# Patient Record
Sex: Male | Born: 1985
Health system: Southern US, Community
[De-identification: ages and names within clinical notes are randomized; demographics above are authoritative.]

## PROBLEM LIST (undated history)

## (undated) DIAGNOSIS — M1990 Unspecified osteoarthritis, unspecified site: Secondary | ICD-10-CM

## (undated) DIAGNOSIS — M419 Scoliosis, unspecified: Secondary | ICD-10-CM

## (undated) DIAGNOSIS — L4050 Arthropathic psoriasis, unspecified: Secondary | ICD-10-CM

## (undated) HISTORY — PX: KNEE SURGERY: SHX244

## (undated) HISTORY — PX: TONSILLECTOMY: SUR1361

---
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
## 2015-05-13 DIAGNOSIS — L4050 Arthropathic psoriasis, unspecified: Secondary | ICD-10-CM | POA: Diagnosis not present

## 2015-05-13 DIAGNOSIS — M419 Scoliosis, unspecified: Secondary | ICD-10-CM | POA: Diagnosis not present

## 2015-05-13 DIAGNOSIS — M549 Dorsalgia, unspecified: Secondary | ICD-10-CM | POA: Diagnosis not present

## 2015-05-13 DIAGNOSIS — M2550 Pain in unspecified joint: Secondary | ICD-10-CM | POA: Diagnosis not present

## 2015-05-13 DIAGNOSIS — L409 Psoriasis, unspecified: Secondary | ICD-10-CM | POA: Diagnosis not present

## 2015-06-17 DIAGNOSIS — L409 Psoriasis, unspecified: Secondary | ICD-10-CM | POA: Diagnosis not present

## 2015-06-17 DIAGNOSIS — M419 Scoliosis, unspecified: Secondary | ICD-10-CM | POA: Diagnosis not present

## 2015-06-17 DIAGNOSIS — L4050 Arthropathic psoriasis, unspecified: Secondary | ICD-10-CM | POA: Diagnosis not present

## 2015-06-17 DIAGNOSIS — M2550 Pain in unspecified joint: Secondary | ICD-10-CM | POA: Diagnosis not present

## 2015-07-29 DIAGNOSIS — M2550 Pain in unspecified joint: Secondary | ICD-10-CM | POA: Diagnosis not present

## 2015-07-29 DIAGNOSIS — L4050 Arthropathic psoriasis, unspecified: Secondary | ICD-10-CM | POA: Diagnosis not present

## 2015-07-29 DIAGNOSIS — M419 Scoliosis, unspecified: Secondary | ICD-10-CM | POA: Diagnosis not present

## 2015-07-29 DIAGNOSIS — L409 Psoriasis, unspecified: Secondary | ICD-10-CM | POA: Diagnosis not present

## 2015-10-03 ENCOUNTER — Emergency Department (HOSPITAL_COMMUNITY)
Admission: EM | Admit: 2015-10-03 | Discharge: 2015-10-03 | Disposition: A | Discharge status: Home / Self Care | Payer: BLUE CROSS/BLUE SHIELD | Attending: Emergency Medicine | Admitting: Emergency Medicine

## 2015-10-03 ENCOUNTER — Encounter (HOSPITAL_COMMUNITY): Payer: Self-pay | Admitting: Emergency Medicine

## 2015-10-03 DIAGNOSIS — Y939 Activity, unspecified: Secondary | ICD-10-CM | POA: Insufficient documentation

## 2015-10-03 DIAGNOSIS — Y999 Unspecified external cause status: Secondary | ICD-10-CM | POA: Insufficient documentation

## 2015-10-03 DIAGNOSIS — Y929 Unspecified place or not applicable: Secondary | ICD-10-CM | POA: Insufficient documentation

## 2015-10-03 DIAGNOSIS — W57XXXA Bitten or stung by nonvenomous insect and other nonvenomous arthropods, initial encounter: Secondary | ICD-10-CM | POA: Diagnosis not present

## 2015-10-03 DIAGNOSIS — Z23 Encounter for immunization: Secondary | ICD-10-CM | POA: Diagnosis not present

## 2015-10-03 DIAGNOSIS — L0291 Cutaneous abscess, unspecified: Secondary | ICD-10-CM

## 2015-10-03 DIAGNOSIS — L03119 Cellulitis of unspecified part of limb: Secondary | ICD-10-CM

## 2015-10-03 DIAGNOSIS — L03115 Cellulitis of right lower limb: Secondary | ICD-10-CM | POA: Diagnosis not present

## 2015-10-03 DIAGNOSIS — L02415 Cutaneous abscess of right lower limb: Secondary | ICD-10-CM | POA: Diagnosis not present

## 2015-10-03 DIAGNOSIS — L02416 Cutaneous abscess of left lower limb: Secondary | ICD-10-CM | POA: Insufficient documentation

## 2015-10-03 DIAGNOSIS — L0211 Cutaneous abscess of neck: Secondary | ICD-10-CM | POA: Diagnosis not present

## 2015-10-03 DIAGNOSIS — S80861A Insect bite (nonvenomous), right lower leg, initial encounter: Secondary | ICD-10-CM | POA: Diagnosis not present

## 2015-10-03 DIAGNOSIS — L0390 Cellulitis, unspecified: Secondary | ICD-10-CM | POA: Diagnosis not present

## 2015-10-03 DIAGNOSIS — S80862A Insect bite (nonvenomous), left lower leg, initial encounter: Secondary | ICD-10-CM | POA: Diagnosis not present

## 2015-10-03 HISTORY — DX: Arthropathic psoriasis, unspecified: L40.50

## 2015-10-03 HISTORY — DX: Scoliosis, unspecified: M41.9

## 2015-10-03 MED ORDER — LIDOCAINE-EPINEPHRINE (PF) 2 %-1:200000 IJ SOLN
20.0000 mL | Freq: Once | INTRAMUSCULAR | Status: AC
  Administered 2015-10-03: 20 mL
  Filled 2015-10-03: qty 20

## 2015-10-03 MED ORDER — TETANUS-DIPHTH-ACELL PERTUSSIS 5-2.5-18.5 LF-MCG/0.5 IM SUSP
0.5000 mL | Freq: Once | INTRAMUSCULAR | Status: AC
Start: 2015-10-03 — End: 2015-10-03
  Administered 2015-10-03: 0.5 mL via INTRAMUSCULAR
  Filled 2015-10-03: qty 0.5

## 2015-10-03 MED ORDER — CEPHALEXIN 250 MG PO CAPS
500.0000 mg | ORAL_CAPSULE | Freq: Once | ORAL | Status: AC
  Administered 2015-10-03: 500 mg via ORAL
  Filled 2015-10-03: qty 2

## 2015-10-03 MED ORDER — CEPHALEXIN 500 MG PO CAPS: 500.0000 mg | ORAL_CAPSULE | Freq: Four times a day (QID) | ORAL | 0 refills | Status: DC

## 2015-10-03 NOTE — ED Notes (Signed)
Pt verbalized understanding of d/c instructions and has no further questions. Pt is stable, A&Ox4, VSS.  

## 2015-10-03 NOTE — ED Notes (Signed)
PA in room to suture wounds.

## 2015-10-03 NOTE — ED Notes (Signed)
Suture cart and I&D kit at bedside for PA

## 2015-10-03 NOTE — ED Provider Notes (Signed)
[facility] DEPT Provider Note   CSN: [id number] Arrival date & time: 10/03/15  [id number]  By signing my name below, I, Ephriam Jenkins, attest that this documentation has been prepared under the direction and in the presence of [facility].  Electronically Signed: Ephriam Jenkins, ED Scribe. 10/03/15. 8:26 PM.   History   Chief Complaint Chief Complaint  Patient presents with  . Insect Bite    HPI HPI Comments: Joshua English is a 30 y.o. male who presents to the Emergency Department complaining of constant 5/10 throbbing pain from multiple insect bites, onset five days ago. Pt went camping six days ago and is not sure what he was bit by. Pt has approximately 10 insect bites to his legs and one to the back of his head. Pt states that the insect bites have been gradually worsening and swelling. Pt states "the bites don't itch, they just hurt.". Pt's was seen by his PCP today and was told to come to the ED for evaluation. Pt is currently taking methotrexate for arthritis and reports allergies to multiple abx. He denies fever, chills, headache, dizziness, weakness, numbness or tingling.   The history is provided by the patient. No language interpreter was used.    Past Medical History:  Diagnosis Date  . Psoriatic arthritis ([place])   . Scoliosis     There are no active problems to display for this patient.   Past Surgical History:  Procedure Laterality Date  . BACK SURGERY    . KNEE SURGERY Left      Home Medications    Prior to Admission medications   Medication Sig Start Date End Date Taking? Authorizing Provider  cephALEXin (KEFLEX) 500 MG capsule Take 1 capsule (500 mg total) by mouth 4 (four) times daily. 10/03/15   Kalman Drape, PA    Family History History reviewed. No pertinent family history.  Social History Social History  Substance Use Topics  . Smoking status: Never Smoker  . Smokeless tobacco: Never Used  . Alcohol use Yes     Comment: occasional      Allergies   Review of patient's allergies indicates not on file.   Review of Systems Review of Systems  Constitutional: Negative for chills and fever.  Skin: Positive for wound (multiple insect bites to BLE).  Neurological: Negative for dizziness, weakness, numbness and headaches.  All other systems reviewed and are negative.    Physical Exam Updated Vital Signs BP 127/90   Pulse 108   Temp 98.5 F (36.9 C)   Resp 20   Wt 192 lb (87.1 kg)   SpO2 98%   Physical Exam  Constitutional: He is oriented to person, place, and time. He appears well-developed and well-nourished. No distress.  HENT:  Head: Normocephalic and atraumatic.  Neck: Normal range of motion.  Pulmonary/Chest: Effort normal.  Musculoskeletal:  Full AROM of left knee without pain, patient neurovascularly intact distally of BLE  Neurological: He is alert and oriented to person, place, and time.  Skin: Skin is warm and dry. He is not diaphoretic.  Abscesses noted to left lateral leg and right lateral mid calf with surrounding signs of cellulitis. Also small abscess and to back of neck less than 1.5 cm in diameter. Small areas of suspected early-stage cellulitis from suspected bug bite. No other rashes noted.  Psychiatric: He has a normal mood and affect. Judgment normal.  Nursing note and vitals reviewed.   Left leg       Right leg  ED Treatments / Results  DIAGNOSTIC STUDIES: Oxygen Saturation is 98% on RA, normal by my interpretation.  COORDINATION OF CARE: 8:25 PM-Discussed treatment plan with pt at bedside and pt agreed to plan.   Labs (all labs ordered are listed, but only abnormal results are displayed) Labs Reviewed - No data to display  EKG  EKG Interpretation None       Radiology No results found.  Procedures .Marland KitchenIncision and Drainage Date/Time: 10/04/2015 2:09 AM Performed by: Claris Gower, Yuli Lanigan L Authorized by: Jackson Latino L   Consent:    Consent obtained:  Verbal    Consent given by:  Patient   Risks discussed:  Bleeding, incomplete drainage, pain and infection Location:    Type:  Abscess   Location:  Lower extremity (neck and left lateral knee)   Lower extremity location:  Leg   Leg location:  R lower leg Pre-procedure details:    Skin preparation:  Betadine Anesthesia (see MAR for exact dosages):    Anesthesia method:  Local infiltration   Local anesthetic:  Lidocaine 2% WITH epi Procedure type:    Complexity:  Simple Procedure details:    Incision types:  Stab incision   Incision depth:  Submucosal   Scalpel blade:  15   Wound management:  Probed and deloculated and irrigated with saline   Drainage:  Bloody and purulent   Drainage amount:  Moderate   Wound treatment:  Wound left open Post-procedure details:    Patient tolerance of procedure:  Tolerated well, no immediate complications   (including critical care time)  Medications Ordered in ED Medications  lidocaine-EPINEPHrine (XYLOCAINE W/EPI) 2 %-1:200000 (PF) injection 20 mL (20 mLs Infiltration Given 10/03/15 2118)  Tdap (BOOSTRIX) injection 0.5 mL (0.5 mLs Intramuscular Given 10/03/15 2117)  cephALEXin (KEFLEX) capsule 500 mg (500 mg Oral Given 10/03/15 2208)     Initial Impression / Assessment and Plan / ED Course  I have reviewed the triage vital signs and the nursing notes.  Pertinent labs & imaging results that were available during my care of the patient were reviewed by me and considered in my medical decision making (see chart for details).  Clinical Course   Patient with 3 skin abscess amenable to incision and drainage.  Abscesses were not large enough to warrant packing or drain. Encouraged home warm soaks and flushing.  Mild signs of cellulitis of surrounding skin.  Will d/c to home with Keflex. Tdap given. Pt states the only antibiotics he can take are Augmentin and amoxicillin. He is actually not sure of all the antibiotics that he is allergic to. Patient on  methotrexate. Was sent to the emergency department by PCP to be evaluated. Patient afebrile, no systemic symptoms no indication for IV antibiotics at this time. Instructed patient to follow up with PCP or return to the emergency department to have wounds reevaluated in 2 days. Discussed strict return precautions. Patient expressed understanding the discharge instructions.  I personally performed the services described in this documentation, which was scribed in my presence. The recorded information has been reviewed and is accurate.   Patient case discussed by Dr. Eulis Foster who agrees with the above plan.  Final Clinical Impressions(s) / ED Diagnoses   Final diagnoses:  Cellulitis of lower extremity, unspecified laterality  Abscess  Infected insect bite    New Prescriptions Discharge Medication List as of 10/03/2015 10:54 PM    START taking these medications   Details  cephALEXin (KEFLEX) 500 MG capsule Take 1 capsule (500 mg total)  by mouth 4 (four) times daily., Starting Thu 10/03/2015, [place], [place] 10/04/15 0215    Daleen Bo, MD 10/08/15 [phone]

## 2015-10-03 NOTE — ED Triage Notes (Signed)
Pt here with approx 10 insect bites to legs. Pt reports that he was camping this weekend and got bit. Pt does not know what bit him. Pt was sent here by PCP with recommendations of IV abx and possible I&d. Pt denies fever/chills at home.

## 2015-10-03 NOTE — ED Notes (Signed)
This RN and Charm Rings in room dressing pt's I&D incisions.

## 2015-10-03 NOTE — Discharge Instructions (Signed)
Take the Keflex as prescribed and be sure to complete the entire course. Discontinue this antibiotics if your experience rash, swelling of your lips tongue or throat, or difficulty breathing and return immediately to the emergency department. Follow-up with your primary care provider or urgent care in 1-2 days to have your wounds reevaluated. Keep wounds clean and dry.  Return to the emergency department if you experience fevers, worsening pain, redness, swelling, red streaks of and around your wounds, or any other concerning symptoms.

## 2015-10-05 ENCOUNTER — Encounter (HOSPITAL_COMMUNITY): Payer: Self-pay | Admitting: Emergency Medicine

## 2015-10-05 ENCOUNTER — Ambulatory Visit (HOSPITAL_COMMUNITY)
Admission: EM | Admit: 2015-10-05 | Discharge: 2015-10-05 | Disposition: A | Discharge status: Home / Self Care | Payer: BLUE CROSS/BLUE SHIELD

## 2015-10-05 DIAGNOSIS — L0291 Cutaneous abscess, unspecified: Secondary | ICD-10-CM

## 2015-10-05 DIAGNOSIS — L03119 Cellulitis of unspecified part of limb: Secondary | ICD-10-CM

## 2015-10-05 NOTE — Discharge Instructions (Signed)
Nice to meet you. Looks like the Keflex is doing well compared to your pictures from the ED. Keep these areas clean with anti-bacterial soap and water. Do not submerge in water but showers are ok. Hold your Enbrel and Methotrexate until infection has cleared and no open sores. Further questions to your Rheumatologist. Take all of your Keflex. If you begin to have worsening infection then please go to the ED. Otherwise f/u with PCP.

## 2015-10-05 NOTE — ED Provider Notes (Signed)
CSN: [id number]     Arrival date & time 10/05/15  1207 History   First MD Initiated Contact with Patient 10/05/15 1259     Chief Complaint  Patient presents with  . Insect Bite   (Consider location/radiation/quality/duration/timing/severity/associated sxs/prior Treatment) 30 yo male who is immunocompromised from immunosuppressions (PsA) presents for f/u of recent cellulitis and abscess. (see previous ER note). He was told to return here for a check up to ensure that he is improving. Patient has had 2 days of Keflex which he is tolerating well. He reports that the wounds are draining, but no longer sore and are improving in overall redness. He reports no fever, n, v.       Past Medical History:  Diagnosis Date  . Psoriatic arthritis ([place])   . Scoliosis    Past Surgical History:  Procedure Laterality Date  . BACK SURGERY    . KNEE SURGERY Left    Family History  Problem Relation Age of Onset  . Heart attack Father   . Diabetes Maternal Grandmother   . Hypertension Maternal Grandmother   . Kidney disease Maternal Grandmother    Social History  Substance Use Topics  . Smoking status: Never Smoker  . Smokeless tobacco: Never Used  . Alcohol use Yes     Comment: occasional    Review of Systems  Constitutional: Negative for appetite change and chills.    Allergies  Review of patient's allergies indicates not on file.  Home Medications   Prior to Admission medications   Medication Sig Start Date End Date Taking? Authorizing Provider  cephALEXin (KEFLEX) 500 MG capsule Take 1 capsule (500 mg total) by mouth 4 (four) times daily. 10/03/15  Yes Jessica L Focht, PA  CYCLOBENZAPRINE HCL PO Take by mouth.   Yes Historical Provider, MD  Etanercept (ENBREL [place]) Inject into the skin.   Yes Historical Provider, MD  folic acid (FOLVITE) 1 MG tablet Take 1 mg by mouth daily.   Yes Historical Provider, MD  MELOXICAM PO Take by mouth 2 (two) times daily.   Yes Historical Provider, MD   methotrexate (RHEUMATREX) 2.5 MG tablet Take 7.5 mg by mouth 3 (three) times a week.   Yes Historical Provider, MD   Meds Ordered and Administered this Visit  Medications - No data to display  BP 123/83 (BP Location: Left Arm)   Pulse 87   Temp 98.3 F (36.8 C) (Oral)   Resp 14   SpO2 100%  No data found.   Physical Exam  Constitutional: He is oriented to person, place, and time. He appears well-developed and well-nourished. No distress.  Neurological: He is alert and oriented to person, place, and time.  Skin: Skin is warm and dry. Rash noted. He is not diaphoretic. There is erythema.  Lef tLE lateral wound with granulation tissue formation and only mild surrounding erythema, right lateral lowerLE wound with mild drainage, no streaking and no warmth. Mild wound to posterior scalp healing nicely.  Psychiatric: His behavior is normal.  Nursing note and vitals reviewed.   Urgent Care Course   Clinical Course    Procedures (including critical care time)  Labs Review Labs Reviewed - No data to display  Imaging Review No results found.   Visual Acuity Review  Right Eye Distance:   Left Eye Distance:   Bilateral Distance:    Right Eye Near:   Left Eye Near:    Bilateral Near:         MDM  1. Cellulitis of lower extremity, unspecified laterality   2. Abscess    1. Seems to be improving with Keflex x 48 hours. Wound care is discussed. No need for additional abx therapy. Must hold both Methotrexate and Enbrel while active infection and on antibiotics. Further question regarding this to Dr. Dossie Der. FU if needed or worsening signs of infection.    Bjorn Pippin, PA-C 10/05/15 1318

## 2015-10-05 NOTE — ED Triage Notes (Signed)
Patient seen in ed for insect bites.  Told to follow up today.  Patient reports swelling has decreased, pain is less.  Areas are not as warm to touch as they have been

## 2015-10-29 DIAGNOSIS — M419 Scoliosis, unspecified: Secondary | ICD-10-CM | POA: Diagnosis not present

## 2015-10-29 DIAGNOSIS — M2550 Pain in unspecified joint: Secondary | ICD-10-CM | POA: Diagnosis not present

## 2015-10-29 DIAGNOSIS — L4050 Arthropathic psoriasis, unspecified: Secondary | ICD-10-CM | POA: Diagnosis not present

## 2015-10-29 DIAGNOSIS — L409 Psoriasis, unspecified: Secondary | ICD-10-CM | POA: Diagnosis not present

## 2015-11-05 ENCOUNTER — Ambulatory Visit: Payer: BLUE CROSS/BLUE SHIELD | Attending: Rheumatology

## 2015-11-05 DIAGNOSIS — R293 Abnormal posture: Secondary | ICD-10-CM | POA: Insufficient documentation

## 2015-11-05 DIAGNOSIS — L4050 Arthropathic psoriasis, unspecified: Secondary | ICD-10-CM | POA: Diagnosis not present

## 2015-11-05 DIAGNOSIS — M546 Pain in thoracic spine: Secondary | ICD-10-CM

## 2015-11-05 NOTE — Therapy (Signed)
[place], [place], [address] Phone: [phone]   Fax:  [phone]  Physical Therapy Evaluation/FCE  Patient Details  Name: Joshua English MRN: [id number] Date of Birth: 23-Feb-1985 No Data Recorded  Encounter Date: 11/05/2015      PT End of Session - 11/05/15 1125    Visit Number 1   Number of Visits 1   PT Start Time 0730   PT Stop Time 1105   PT Time Calculation (min) 215 min   Activity Tolerance Patient tolerated treatment well  back pain did increase   Behavior During Therapy [facility] for tasks assessed/performed      Past Medical History:  Diagnosis Date  . Psoriatic arthritis ([place])   . Scoliosis     Past Surgical History:  Procedure Laterality Date  . BACK SURGERY    . KNEE SURGERY Left     There were no vitals filed for this visit.       Subjective Assessment - 11/05/15 1124    Subjective See FCE scanned in EPIC for brief history                             Results of the FCE are scanned into EPIC. Vocational counseling and management of HR and BP may be helpful for Mr Matzke to maximize employment potential.               Plan - 11/05/15 1127    Clinical Impression Statement Mr Juniper completed the FCE testing  with increase in back pain. His HR and BP were elevated throughout the testing but was within parameters to continue testing until the testing was completed   PT Next Visit Plan None. PT to follow up with referring MD   Recommended Other Services Vocational counseling   Consulted and Agree with Plan of Care Patient      Patient will benefit from skilled therapeutic intervention in order to improve the following deficits and impairments:     Visit Diagnosis: Psoriatic arthritis ([place]) - Plan: PT plan of care cert/re-cert  Abnormal posture - Plan: PT plan of care cert/re-cert  Pain in thoracic spine - Plan: PT plan of care  cert/re-cert     Problem List There are no active problems to display for this patient.   Darrel Hoover 11/05/2015, 11:34 AM  [facility] [address] [place], [place], [address] Phone: [phone]   Fax:  [phone]  Name: Rahil Reh MRN: [id number] Date of Birth: 1986/01/26

## 2015-11-28 DIAGNOSIS — M2550 Pain in unspecified joint: Secondary | ICD-10-CM | POA: Diagnosis not present

## 2015-11-28 DIAGNOSIS — L409 Psoriasis, unspecified: Secondary | ICD-10-CM | POA: Diagnosis not present

## 2015-11-28 DIAGNOSIS — M419 Scoliosis, unspecified: Secondary | ICD-10-CM | POA: Diagnosis not present

## 2015-11-28 DIAGNOSIS — L4050 Arthropathic psoriasis, unspecified: Secondary | ICD-10-CM | POA: Diagnosis not present

## 2016-03-03 DIAGNOSIS — M2550 Pain in unspecified joint: Secondary | ICD-10-CM | POA: Diagnosis not present

## 2016-03-03 DIAGNOSIS — L409 Psoriasis, unspecified: Secondary | ICD-10-CM | POA: Diagnosis not present

## 2016-03-03 DIAGNOSIS — M419 Scoliosis, unspecified: Secondary | ICD-10-CM | POA: Diagnosis not present

## 2016-03-03 DIAGNOSIS — L4050 Arthropathic psoriasis, unspecified: Secondary | ICD-10-CM | POA: Diagnosis not present

## 2016-06-01 DIAGNOSIS — L4050 Arthropathic psoriasis, unspecified: Secondary | ICD-10-CM | POA: Diagnosis not present

## 2016-06-01 DIAGNOSIS — M419 Scoliosis, unspecified: Secondary | ICD-10-CM | POA: Diagnosis not present

## 2016-06-01 DIAGNOSIS — L409 Psoriasis, unspecified: Secondary | ICD-10-CM | POA: Diagnosis not present

## 2016-06-01 DIAGNOSIS — M2550 Pain in unspecified joint: Secondary | ICD-10-CM | POA: Diagnosis not present

## 2016-06-02 DIAGNOSIS — L4050 Arthropathic psoriasis, unspecified: Secondary | ICD-10-CM | POA: Diagnosis not present

## 2016-06-23 DIAGNOSIS — L4050 Arthropathic psoriasis, unspecified: Secondary | ICD-10-CM | POA: Diagnosis not present

## 2016-07-21 DIAGNOSIS — L4050 Arthropathic psoriasis, unspecified: Secondary | ICD-10-CM | POA: Diagnosis not present

## 2016-08-26 DIAGNOSIS — L409 Psoriasis, unspecified: Secondary | ICD-10-CM | POA: Diagnosis not present

## 2016-08-26 DIAGNOSIS — L4050 Arthropathic psoriasis, unspecified: Secondary | ICD-10-CM | POA: Diagnosis not present

## 2016-08-26 DIAGNOSIS — M419 Scoliosis, unspecified: Secondary | ICD-10-CM | POA: Diagnosis not present

## 2016-08-26 DIAGNOSIS — M2550 Pain in unspecified joint: Secondary | ICD-10-CM | POA: Diagnosis not present

## 2016-08-31 DIAGNOSIS — K429 Umbilical hernia without obstruction or gangrene: Secondary | ICD-10-CM | POA: Diagnosis not present

## 2016-09-15 DIAGNOSIS — L4050 Arthropathic psoriasis, unspecified: Secondary | ICD-10-CM | POA: Diagnosis not present

## 2016-09-29 DIAGNOSIS — K429 Umbilical hernia without obstruction or gangrene: Secondary | ICD-10-CM | POA: Diagnosis not present

## 2016-10-21 DIAGNOSIS — R221 Localized swelling, mass and lump, neck: Secondary | ICD-10-CM | POA: Diagnosis not present

## 2016-10-21 DIAGNOSIS — M419 Scoliosis, unspecified: Secondary | ICD-10-CM | POA: Diagnosis not present

## 2016-10-29 ENCOUNTER — Ambulatory Visit: Payer: Self-pay | Admitting: General Surgery

## 2016-11-04 ENCOUNTER — Encounter (HOSPITAL_COMMUNITY): Payer: Self-pay

## 2016-11-04 ENCOUNTER — Encounter (HOSPITAL_COMMUNITY)
Admission: RE | Admit: 2016-11-04 | Discharge: 2016-11-04 | Disposition: A | Discharge status: Home / Self Care | Payer: BLUE CROSS/BLUE SHIELD | Source: Ambulatory Visit | Attending: General Surgery | Admitting: General Surgery

## 2016-11-04 DIAGNOSIS — Z981 Arthrodesis status: Secondary | ICD-10-CM | POA: Diagnosis not present

## 2016-11-04 DIAGNOSIS — Z01810 Encounter for preprocedural cardiovascular examination: Secondary | ICD-10-CM | POA: Insufficient documentation

## 2016-11-04 DIAGNOSIS — Z01812 Encounter for preprocedural laboratory examination: Secondary | ICD-10-CM | POA: Diagnosis not present

## 2016-11-04 HISTORY — DX: Unspecified osteoarthritis, unspecified site: M19.90

## 2016-11-04 LAB — CBC
HCT: 42 % (ref 39.0–52.0)
Hemoglobin: 14.1 g/dL (ref 13.0–17.0)
MCH: 31.4 pg (ref 26.0–34.0)
MCHC: 33.6 g/dL (ref 30.0–36.0)
MCV: 93.5 fL (ref 78.0–100.0)
PLATELETS: 258 10*3/uL (ref 150–400)
RBC: 4.49 MIL/uL (ref 4.22–5.81)
RDW: 13.1 % (ref 11.5–15.5)
WBC: 10.2 10*3/uL (ref 4.0–10.5)

## 2016-11-04 LAB — BASIC METABOLIC PANEL
Anion gap: 5 (ref 5–15)
BUN: 9 mg/dL (ref 6–20)
CALCIUM: 9.4 mg/dL (ref 8.9–10.3)
CHLORIDE: 104 mmol/L (ref 101–111)
CO2: 26 mmol/L (ref 22–32)
CREATININE: 0.78 mg/dL (ref 0.61–1.24)
GFR calc non Af Amer: >60 mL/min (ref >60)
GLUCOSE: 95 mg/dL (ref 65–99)
Potassium: 4 mmol/L (ref 3.5–5.1)
Sodium: 135 mmol/L (ref 135–145)

## 2016-11-04 NOTE — Progress Notes (Signed)
Pt. Denies all chest concerns, no flu like symptoms. Pt. Denies any prior cardiac care in the way of stress test, ekg, cardiology visit.  No Remicade for 7 weeks. Pt. Was not told to stop methotrexate. Pt. Told to hold day of surgery when its due next. Call to A. Kabbe,DNP- to report ^ BP & ^ pulse on palpation.  Requesting records from Dr. PCP- Ashby Dawes.

## 2016-11-04 NOTE — Progress Notes (Signed)
Pt. Reports that he was told that he had allergies to multiples drugs connected with back fusion was 31 y.o. , pt. Doesn't know what the drugs were or what the reactions were.

## 2016-11-04 NOTE — Pre-Procedure Instructions (Addendum)
Joshua English  11/04/2016      [facility] ([place]), [place] [address] [place] ([place]) [place] [address] Phone: [phone] Fax: [phone]    Your procedure is scheduled on 11/09/2016  Report to [facility] Admitting at 08:30 A.M.  Call this number if you have problems the morning of surgery:  [phone]   Remember:  Do not eat food or drink liquids after midnight.  On Sunday  EXception, finish juice Berry drink by 630am the morning of your surgery   Take these medicines the morning of surgery with A SIP OF WATER :  NONE  Stop MELOXICAM now    Do not wear jewelry   Do not wear lotions, powders, or perfumes, or deoderant.              Men may shave face and neck.   Do not bring valuables to the hospital.   [facility] is not responsible for any belongings or valuables.  Contacts, dentures or bridgework may not be worn into surgery.  Leave your suitcase in the car.  After surgery it may be brought to your room.  For patients admitted to the hospital, discharge time will be determined by your treatment team.  Patients discharged the day of surgery will not be allowed to drive home.   Name and phone number of your driver:   with Mother   Special instructions:  Special Instructions: [facility] - Preparing for Surgery  Before surgery, you can play an important role.  Because skin is not sterile, your skin needs to be as free of germs as possible.  You can reduce the number of germs on you skin by washing with CHG (chlorahexidine gluconate) soap before surgery.  CHG is an antiseptic cleaner which kills germs and bonds with the skin to continue killing germs even after washing.  Please DO NOT use if you have an allergy to CHG or antibacterial soaps.  If your skin becomes reddened/irritated stop using the CHG and inform your nurse when you arrive at Short Stay.  Do not shave (including legs and underarms) for at  least 48 hours prior to the first CHG shower.  You may shave your face.  Please follow these instructions carefully:   1.  Shower with CHG Soap the night before surgery and the  morning of Surgery.  2.  If you choose to wash your hair, wash your hair first as usual with your  normal shampoo.  3.  After you shampoo, rinse your hair and body thoroughly to remove the  Shampoo.  4.  Use CHG as you would any other liquid soap.  You can apply chg directly to the skin and wash gently with scrungie or a clean washcloth.  5.  Apply the CHG Soap to your body ONLY FROM THE NECK DOWN.    Do not use on open wounds or open sores.  Avoid contact with your eyes, ears, mouth and genitals (private parts).  Wash genitals (private parts)   with your normal soap.  6.  Wash thoroughly, paying special attention to the area where your surgery will be performed.  7.  Thoroughly rinse your body with warm water from the neck down.  8.  DO NOT shower/wash with your normal soap after using and rinsing off   the CHG Soap.  9.  Pat yourself dry with a clean towel.  10.  Wear clean pajamas.            11.  Place clean sheets on your bed the night of your first shower and do not sleep with pets.  Day of Surgery  Do not apply any lotions/deodorants the morning of surgery.  Please wear clean clothes to the hospital/surgery center.  Please read over the following fact sheets that you were given. Pain Booklet, Coughing and Deep Breathing and Surgical Site Infection Prevention

## 2016-11-05 NOTE — Progress Notes (Signed)
Anesthesia Chart Review:  Pt is a 31 year old male scheduled for umbilical hernia repair with mesh on 11/09/2016 with Autumn Messing III, MD  - PCP is Merrilee Seashore, MD  PMH includes:  Scoliosis, psoriatic arthritis  Medications include: remicade (last dose 09/23/16), methotrexate.   BP (!) 146/86   Pulse (!) 102   Temp 37.1 C   Resp 20   Ht 5\' 5"  (1.651 m)   Wt 203 lb 14.4 oz (92.5 kg)   SpO2 98%   BMI 33.93 kg/m   Initially BP was 141/93, pulse 105; recheck BP 146/26, pulse 102. - Pt denied feeling palpitations or heart racing to PAT RN.  PAT RN reports pt seemed anxious about surgery  Records from PCP's office showed pt's BP was 120/80, HR 92 on 08/31/16.  Will forward my note to PCP for f/u of BP.   Preoperative labs reviewed.    EKG 11/04/16: Sinus tachycardia (107)  If no changes, I anticipate pt can proceed with surgery as scheduled.   Willeen Cass, FNP-BC [facility] Short Stay Surgical Center/Anesthesiology Phone: [phone] 11/05/2016 2:51 PM

## 2016-11-09 ENCOUNTER — Encounter (HOSPITAL_COMMUNITY)
Admission: RE | Disposition: A | Discharge status: Home / Self Care | Payer: Self-pay | Source: Ambulatory Visit | Attending: General Surgery

## 2016-11-09 ENCOUNTER — Ambulatory Visit (HOSPITAL_COMMUNITY)
Admission: RE | Admit: 2016-11-09 | Discharge: 2016-11-09 | Disposition: A | Discharge status: Home / Self Care | Payer: BLUE CROSS/BLUE SHIELD | Source: Ambulatory Visit | Attending: General Surgery | Admitting: General Surgery

## 2016-11-09 ENCOUNTER — Encounter (HOSPITAL_COMMUNITY): Payer: Self-pay | Admitting: Anesthesiology

## 2016-11-09 ENCOUNTER — Ambulatory Visit (HOSPITAL_COMMUNITY): Payer: BLUE CROSS/BLUE SHIELD | Admitting: Anesthesiology

## 2016-11-09 ENCOUNTER — Ambulatory Visit (HOSPITAL_COMMUNITY): Payer: BLUE CROSS/BLUE SHIELD | Admitting: Emergency Medicine

## 2016-11-09 DIAGNOSIS — Z9889 Other specified postprocedural states: Secondary | ICD-10-CM | POA: Diagnosis not present

## 2016-11-09 DIAGNOSIS — L4050 Arthropathic psoriasis, unspecified: Secondary | ICD-10-CM | POA: Insufficient documentation

## 2016-11-09 DIAGNOSIS — Z79899 Other long term (current) drug therapy: Secondary | ICD-10-CM | POA: Insufficient documentation

## 2016-11-09 DIAGNOSIS — E669 Obesity, unspecified: Secondary | ICD-10-CM | POA: Diagnosis not present

## 2016-11-09 DIAGNOSIS — Z8371 Family history of colonic polyps: Secondary | ICD-10-CM | POA: Diagnosis not present

## 2016-11-09 DIAGNOSIS — K429 Umbilical hernia without obstruction or gangrene: Secondary | ICD-10-CM | POA: Diagnosis not present

## 2016-11-09 DIAGNOSIS — Z8249 Family history of ischemic heart disease and other diseases of the circulatory system: Secondary | ICD-10-CM | POA: Insufficient documentation

## 2016-11-09 HISTORY — PX: UMBILICAL HERNIA REPAIR: SHX196

## 2016-11-09 HISTORY — PX: INSERTION OF MESH: SHX5868

## 2016-11-09 SURGERY — REPAIR, HERNIA, UMBILICAL, ADULT
Anesthesia: General | Site: Abdomen

## 2016-11-09 MED ORDER — PROMETHAZINE HCL 25 MG/ML IJ SOLN: 6.2500 mg | INTRAMUSCULAR | Status: DC | PRN

## 2016-11-09 MED ORDER — CEFAZOLIN SODIUM-DEXTROSE 2-4 GM/100ML-% IV SOLN
2.0000 g | INTRAVENOUS | Status: AC
  Administered 2016-11-09: 2 g via INTRAVENOUS

## 2016-11-09 MED ORDER — MIDAZOLAM HCL 5 MG/5ML IJ SOLN
INTRAMUSCULAR | Status: DC | PRN
  Administered 2016-11-09: 2 mg via INTRAVENOUS

## 2016-11-09 MED ORDER — CHLORHEXIDINE GLUCONATE CLOTH 2 % EX PADS: 6.0000 | MEDICATED_PAD | Freq: Once | CUTANEOUS | Status: DC

## 2016-11-09 MED ORDER — FENTANYL CITRATE (PF) 250 MCG/5ML IJ SOLN
INTRAMUSCULAR | Status: AC
  Filled 2016-11-09: qty 5

## 2016-11-09 MED ORDER — PROPOFOL 10 MG/ML IV BOLUS
INTRAVENOUS | Status: DC | PRN
  Administered 2016-11-09: 180 mg via INTRAVENOUS

## 2016-11-09 MED ORDER — CELECOXIB 200 MG PO CAPS
200.0000 mg | ORAL_CAPSULE | ORAL | Status: AC
  Administered 2016-11-09: 200 mg via ORAL

## 2016-11-09 MED ORDER — BUPIVACAINE-EPINEPHRINE 0.25% -1:200000 IJ SOLN
INTRAMUSCULAR | Status: DC | PRN
  Administered 2016-11-09: 10 mL

## 2016-11-09 MED ORDER — MEPERIDINE HCL 25 MG/ML IJ SOLN: 6.2500 mg | INTRAMUSCULAR | Status: DC | PRN

## 2016-11-09 MED ORDER — LACTATED RINGERS IV SOLN
INTRAVENOUS | Status: DC
  Administered 2016-11-09: 10:00:00 via INTRAVENOUS

## 2016-11-09 MED ORDER — HYDROCODONE-ACETAMINOPHEN 7.5-325 MG PO TABS
ORAL_TABLET | ORAL | Status: AC
  Filled 2016-11-09: qty 1

## 2016-11-09 MED ORDER — FENTANYL CITRATE (PF) 100 MCG/2ML IJ SOLN
INTRAMUSCULAR | Status: DC | PRN
  Administered 2016-11-09: 150 ug via INTRAVENOUS

## 2016-11-09 MED ORDER — ROCURONIUM BROMIDE 100 MG/10ML IV SOLN
INTRAVENOUS | Status: DC | PRN
  Administered 2016-11-09: 50 mg via INTRAVENOUS

## 2016-11-09 MED ORDER — CEFAZOLIN SODIUM-DEXTROSE 2-4 GM/100ML-% IV SOLN
INTRAVENOUS | Status: AC
  Filled 2016-11-09: qty 100

## 2016-11-09 MED ORDER — ONDANSETRON HCL 4 MG/2ML IJ SOLN
INTRAMUSCULAR | Status: AC
  Filled 2016-11-09: qty 2

## 2016-11-09 MED ORDER — DEXAMETHASONE SODIUM PHOSPHATE 10 MG/ML IJ SOLN
INTRAMUSCULAR | Status: AC
  Filled 2016-11-09: qty 1

## 2016-11-09 MED ORDER — ACETAMINOPHEN 500 MG PO TABS
ORAL_TABLET | ORAL | Status: AC
  Filled 2016-11-09: qty 2

## 2016-11-09 MED ORDER — MIDAZOLAM HCL 2 MG/2ML IJ SOLN
INTRAMUSCULAR | Status: AC
  Filled 2016-11-09: qty 2

## 2016-11-09 MED ORDER — BUPIVACAINE-EPINEPHRINE (PF) 0.25% -1:200000 IJ SOLN
INTRAMUSCULAR | Status: AC
  Filled 2016-11-09: qty 30

## 2016-11-09 MED ORDER — ONDANSETRON HCL 4 MG/2ML IJ SOLN
INTRAMUSCULAR | Status: DC | PRN
  Administered 2016-11-09: 4 mg via INTRAVENOUS

## 2016-11-09 MED ORDER — LIDOCAINE 2% (20 MG/ML) 5 ML SYRINGE
INTRAMUSCULAR | Status: AC
  Filled 2016-11-09: qty 5

## 2016-11-09 MED ORDER — PROPOFOL 10 MG/ML IV BOLUS
INTRAVENOUS | Status: AC
  Filled 2016-11-09: qty 20

## 2016-11-09 MED ORDER — 0.9 % SODIUM CHLORIDE (POUR BTL) OPTIME
TOPICAL | Status: DC | PRN
  Administered 2016-11-09: 1000 mL

## 2016-11-09 MED ORDER — ROCURONIUM BROMIDE 10 MG/ML (PF) SYRINGE
PREFILLED_SYRINGE | INTRAVENOUS | Status: AC
  Filled 2016-11-09: qty 5

## 2016-11-09 MED ORDER — LIDOCAINE HCL (CARDIAC) 20 MG/ML IV SOLN
INTRAVENOUS | Status: DC | PRN
  Administered 2016-11-09: 80 mg via INTRAVENOUS

## 2016-11-09 MED ORDER — ACETAMINOPHEN 500 MG PO TABS
1000.0000 mg | ORAL_TABLET | ORAL | Status: AC
  Administered 2016-11-09: 1000 mg via ORAL

## 2016-11-09 MED ORDER — GABAPENTIN 300 MG PO CAPS
300.0000 mg | ORAL_CAPSULE | ORAL | Status: AC
  Administered 2016-11-09: 300 mg via ORAL

## 2016-11-09 MED ORDER — HYDROCODONE-ACETAMINOPHEN 5-325 MG PO TABS: 1.0000 | ORAL_TABLET | ORAL | 0 refills | Status: DC | PRN

## 2016-11-09 MED ORDER — CELECOXIB 200 MG PO CAPS
ORAL_CAPSULE | ORAL | Status: AC
  Filled 2016-11-09: qty 1

## 2016-11-09 MED ORDER — GABAPENTIN 300 MG PO CAPS
ORAL_CAPSULE | ORAL | Status: AC
  Filled 2016-11-09: qty 1

## 2016-11-09 MED ORDER — SUGAMMADEX SODIUM 200 MG/2ML IV SOLN
INTRAVENOUS | Status: DC | PRN
  Administered 2016-11-09: 185 mg via INTRAVENOUS

## 2016-11-09 MED ORDER — DEXAMETHASONE SODIUM PHOSPHATE 10 MG/ML IJ SOLN
INTRAMUSCULAR | Status: DC | PRN
  Administered 2016-11-09: 10 mg via INTRAVENOUS

## 2016-11-09 MED ORDER — HYDROCODONE-ACETAMINOPHEN 7.5-325 MG PO TABS
1.0000 | ORAL_TABLET | Freq: Once | ORAL | Status: AC | PRN
  Administered 2016-11-09: 1 via ORAL

## 2016-11-09 MED ORDER — HYDROMORPHONE HCL 1 MG/ML IJ SOLN: 0.2500 mg | INTRAMUSCULAR | Status: DC | PRN

## 2016-11-09 SURGICAL SUPPLY — 40 items
BLADE CLIPPER SURG (BLADE) ×2 IMPLANT
BLADE SURG 10 STRL SS (BLADE) ×2 IMPLANT
BLADE SURG 15 STRL LF DISP TIS (BLADE) ×1 IMPLANT
BLADE SURG 15 STRL SS (BLADE) ×1
CHLORAPREP W/TINT 26ML (MISCELLANEOUS) ×2 IMPLANT
COVER SURGICAL LIGHT HANDLE (MISCELLANEOUS) ×2 IMPLANT
DERMABOND ADVANCED (GAUZE/BANDAGES/DRESSINGS) ×1
DERMABOND ADVANCED .7 DNX12 (GAUZE/BANDAGES/DRESSINGS) ×1 IMPLANT
DRAPE LAPAROSCOPIC ABDOMINAL (DRAPES) ×2 IMPLANT
DRAPE UTILITY XL STRL (DRAPES) ×2 IMPLANT
ELECT CAUTERY BLADE 6.4 (BLADE) ×2 IMPLANT
ELECT REM PT RETURN 9FT ADLT (ELECTROSURGICAL) ×2
ELECTRODE REM PT RTRN 9FT ADLT (ELECTROSURGICAL) ×1 IMPLANT
GLOVE BIO SURGEON STRL SZ7.5 (GLOVE) ×2 IMPLANT
GLOVE BIOGEL PI IND STRL 6.5 (GLOVE) ×2 IMPLANT
GLOVE BIOGEL PI INDICATOR 6.5 (GLOVE) ×2
GLOVE SURG SS PI 6.0 STRL IVOR (GLOVE) ×2 IMPLANT
GOWN STRL REUS W/ TWL LRG LVL3 (GOWN DISPOSABLE) ×3 IMPLANT
GOWN STRL REUS W/TWL LRG LVL3 (GOWN DISPOSABLE) ×3
KIT BASIN OR (CUSTOM PROCEDURE TRAY) ×2 IMPLANT
KIT ROOM TURNOVER OR (KITS) ×2 IMPLANT
MESH VENTRALEX ST 2.5 CRC MED (Mesh General) ×2 IMPLANT
NEEDLE HYPO 25GX1X1/2 BEV (NEEDLE) ×2 IMPLANT
NS IRRIG 1000ML POUR BTL (IV SOLUTION) ×2 IMPLANT
PACK SURGICAL SETUP 50X90 (CUSTOM PROCEDURE TRAY) ×2 IMPLANT
PAD ARMBOARD 7.5X6 YLW CONV (MISCELLANEOUS) ×2 IMPLANT
PENCIL BUTTON HOLSTER BLD 10FT (ELECTRODE) ×2 IMPLANT
SPONGE LAP 18X18 X RAY DECT (DISPOSABLE) ×2 IMPLANT
SUT MNCRL AB 4-0 PS2 18 (SUTURE) ×2 IMPLANT
SUT NOVA NAB DX-16 0-1 5-0 T12 (SUTURE) ×2 IMPLANT
SUT PROLENE 0 CT 1 CR/8 (SUTURE) IMPLANT
SUT VIC AB 2-0 SH 27 (SUTURE) ×1
SUT VIC AB 2-0 SH 27X BRD (SUTURE) ×1 IMPLANT
SUT VIC AB 3-0 SH 27 (SUTURE) ×1
SUT VIC AB 3-0 SH 27XBRD (SUTURE) ×1 IMPLANT
SYR BULB 3OZ (MISCELLANEOUS) ×2 IMPLANT
SYR CONTROL 10ML LL (SYRINGE) ×2 IMPLANT
TOWEL OR 17X24 6PK STRL BLUE (TOWEL DISPOSABLE) ×2 IMPLANT
TUBE CONNECTING 12X1/4 (SUCTIONS) ×2 IMPLANT
YANKAUER SUCT BULB TIP NO VENT (SUCTIONS) ×2 IMPLANT

## 2016-11-09 NOTE — Transfer of Care (Signed)
Immediate Anesthesia Transfer of Care Note  Patient: Joshua English  Procedure(s) Performed: HERNIA REPAIR UMBILICAL ADULT (N/A Abdomen) INSERTION OF MESH (N/A Abdomen)  Patient Location: PACU  Anesthesia Type:General  Level of Consciousness: awake, alert  and oriented  Airway & Oxygen Therapy: Patient connected to face mask oxygen  Post-op Assessment: Post -op Vital signs reviewed and stable  Post vital signs: stable  Last Vitals:  Vitals:   11/09/16 0840  BP: (!) 143/98  Pulse: (!) 108  Resp: 19  Temp: 37 C  SpO2: 99%    Last Pain:  Vitals:   11/09/16 0840  TempSrc: Oral         Complications: No apparent anesthesia complications

## 2016-11-09 NOTE — Anesthesia Preprocedure Evaluation (Addendum)
Anesthesia Evaluation  Patient identified by MRN, date of birth, ID band Patient awake    Reviewed: Allergy & Precautions, NPO status , Patient's Chart, lab work & pertinent test results  Airway Mallampati: I  TM Distance: >3 FB Neck ROM: Full    Dental no notable dental hx. (+) Teeth Intact, Dental Advisory Given   Pulmonary neg pulmonary ROS,    Pulmonary exam normal breath sounds clear to auscultation       Cardiovascular negative cardio ROS Normal cardiovascular exam Rhythm:Regular Rate:Normal     Neuro/Psych negative neurological ROS  negative psych ROS   GI/Hepatic negative GI ROS, Neg liver ROS,   Endo/Other  Obesity  Renal/GU negative Renal ROS  negative genitourinary   Musculoskeletal  (+) Arthritis , Psoriatic arthritis Scoliosis- S/P surgery   Abdominal (+) + obese,   Peds  Hematology negative hematology ROS (+)   Anesthesia Other Findings   Reproductive/Obstetrics                            Anesthesia Physical Anesthesia Plan  ASA: II  Anesthesia Plan: General   Post-op Pain Management:    Induction: Intravenous  PONV Risk Score and Plan: 4 or greater and Ondansetron, Dexamethasone, Midazolam, Scopolamine patch - Pre-op and Propofol infusion  Airway Management Planned: Oral ETT  Additional Equipment:   Intra-op Plan:   Post-operative Plan: Extubation in OR  Informed Consent: I have reviewed the patients History and Physical, chart, labs and discussed the procedure including the risks, benefits and alternatives for the proposed anesthesia with the patient or authorized representative who has indicated his/her understanding and acceptance.   Dental advisory given  Plan Discussed with: Anesthesiologist, CRNA and Surgeon  Anesthesia Plan Comments:         Anesthesia Quick Evaluation

## 2016-11-09 NOTE — Discharge Instructions (Signed)
General Anesthesia, Adult, Care After °These instructions provide you with information about caring for yourself after your procedure. Your health care provider may also give you more specific instructions. Your treatment has been planned according to current medical practices, but problems sometimes occur. Call your health care provider if you have any problems or questions after your procedure. °What can I expect after the procedure? °After the procedure, it is common to have: °· Vomiting. °· A sore throat. °· Mental slowness. ° °It is common to feel: °· Nauseous. °· Cold or shivery. °· Sleepy. °· Tired. °· Sore or achy, even in parts of your body where you did not have surgery. ° °Follow these instructions at home: °For at least 24 hours after the procedure: °· Do not: °? Participate in activities where you could fall or become injured. °? Drive. °? Use heavy machinery. °? Drink alcohol. °? Take sleeping pills or medicines that cause drowsiness. °? Make important decisions or sign legal documents. °? Take care of children on your own. °· Rest. °Eating and drinking °· If you vomit, drink water, juice, or soup when you can drink without vomiting. °· Drink enough fluid to keep your urine clear or pale yellow. °· Make sure you have little or no nausea before eating solid foods. °· Follow the diet recommended by your health care provider. °General instructions °· Have a responsible adult stay with you until you are awake and alert. °· Return to your normal activities as told by your health care provider. Ask your health care provider what activities are safe for you. °· Take over-the-counter and prescription medicines only as told by your health care provider. °· If you smoke, do not smoke without supervision. °· Keep all follow-up visits as told by your health care provider. This is important. °Contact a health care provider if: °· You continue to have nausea or vomiting at home, and medicines are not helpful. °· You  cannot drink fluids or start eating again. °· You cannot urinate after 8-12 hours. °· You develop a skin rash. °· You have fever. °· You have increasing redness at the site of your procedure. °Get help right away if: °· You have difficulty breathing. °· You have chest pain. °· You have unexpected bleeding. °· You feel that you are having a life-threatening or urgent problem. °This information is not intended to replace advice given to you by your health care provider. Make sure you discuss any questions you have with your health care provider. °Document Released: 05/04/2000 Document Revised: 07/01/2015 Document Reviewed: 01/10/2015 °Elsevier Interactive Patient Education © 2018 [employer] ° °

## 2016-11-09 NOTE — Op Note (Signed)
11/09/2016  11:19 AM  PATIENT:  Joshua English  31 y.o. male  PRE-OPERATIVE DIAGNOSIS:  UMBILICAL HERNIA  POST-OPERATIVE DIAGNOSIS:  UMBILICAL HERNIA  PROCEDURE:  Procedure(s): HERNIA REPAIR UMBILICAL ADULT (N/A) INSERTION OF MESH (N/A)  SURGEON:  Surgeon(s) and Role:    * Jovita Kussmaul, MD - Primary  PHYSICIAN ASSISTANT:   ASSISTANTS: none   ANESTHESIA:   local and general  EBL:  No intake/output data recorded.  BLOOD ADMINISTERED:none  DRAINS: none   LOCAL MEDICATIONS USED:  MARCAINE     SPECIMEN:  No Specimen  DISPOSITION OF SPECIMEN:  N/A  COUNTS:  YES  TOURNIQUET:  * No tourniquets in log *  DICTATION: .Dragon Dictation   After informed consent was obtained the patient was brought to the operating room and placed in the supine position on the operating table. After adequate induction of general anesthesia the patient's abdomen was prepped with ChloraPrep, allowed to dry, draped in usual sterile manner. An appropriate timeout was performed. The patient had a protuberant umbilical hernia. The area around the umbilicus was infiltrated with quarter percent Marcaine. An elliptical incision was made overlying the hernia with a 15 blade knife so as to remove some of the excess skin. The incision was carried through the skin and subcutaneous tissue sharply with electrocautery until the hernia sac was opened. There was some omentum adherent to the hernia sac which was taken down sharply with electrocautery. Once this was accomplished then there were no more adhesions within the hernia sac. The hernia sac was excised sharply with the electrocautery down to the level of the abdominal wall fascia. The fascia was thick and healthy. A finger was inserted through the hernia defect and there were no obvious adhesions to the anterior abdominal wall. A 6.4 cm piece of Bard mesh for umbilical hernias was chosen. The mesh was inserted through the hernia defect with the coated side towards  the bowel and the anchors were used to pull up on the mesh. The mesh was observed to be in good apposition to the anterior abdominal wall. The hernia defect was then repaired with multiple interrupted #1 Novafil stitches incorporating the tails of the mesh. The excess anchors were removed. Once this was accomplished the hernia seemed to be well repaired and the mesh was in good position. The wound was irrigated with copious amounts of saline and infiltrated with more quarter percent Marcaine. The deep layer of the wound was closed with interrupted 2-0 Vicryl stitches in the base of the umbilicus was tacked to the fascia. The skin was then closed with interrupted 4-0 Monocryl subcuticular stitches. Dermabond dressings were applied. The patient tolerated the procedure well. At the end of the case all needle sponge counts were correct. The patient was then awakened and taken to recovery in stable condition.  PLAN OF CARE: Discharge to home after PACU  PATIENT DISPOSITION:  PACU - hemodynamically stable.   Delay start of Pharmacological VTE agent (>24hrs) due to surgical blood loss or risk of bleeding: not applicable

## 2016-11-09 NOTE — Anesthesia Postprocedure Evaluation (Signed)
Anesthesia Post Note  Patient: Joshua English  Procedure(s) Performed: HERNIA REPAIR UMBILICAL ADULT (N/A Abdomen) INSERTION OF MESH (N/A Abdomen)     Patient location during evaluation: PACU Anesthesia Type: General Level of consciousness: awake and alert and oriented Pain management: pain level controlled Vital Signs Assessment: post-procedure vital signs reviewed and stable Respiratory status: spontaneous breathing, nonlabored ventilation, respiratory function stable and patient connected to nasal cannula oxygen Cardiovascular status: blood pressure returned to baseline and stable Postop Assessment: no apparent nausea or vomiting Anesthetic complications: no    Last Vitals:  Vitals:   11/09/16 1215 11/09/16 1230  BP: 123/85 115/87  Pulse: 81 89  Resp: (!) 22 (!) 22  Temp:  (!) 36.4 C  SpO2: 96% 96%    Last Pain:  Vitals:   11/09/16 1245  TempSrc:   PainSc: 1                  Corrin Sieling A.

## 2016-11-09 NOTE — Anesthesia Procedure Notes (Signed)
Procedure Name: Intubation Date/Time: 11/09/2016 10:41 AM Performed by: Jenne Campus Pre-anesthesia Checklist: Patient identified, Emergency Drugs available, Suction available and Patient being monitored Patient Re-evaluated:Patient Re-evaluated prior to induction Oxygen Delivery Method: Circle System Utilized Preoxygenation: Pre-oxygenation with 100% oxygen Induction Type: IV induction Ventilation: Mask ventilation without difficulty and Oral airway inserted - appropriate to patient size Laryngoscope Size: Miller and 2 Grade View: Grade I Tube type: Oral Tube size: 7.5 mm Number of attempts: 1 Airway Equipment and Method: Stylet and Oral airway Placement Confirmation: ETT inserted through vocal cords under direct vision,  positive ETCO2 and breath sounds checked- equal and bilateral Secured at: 21 cm Tube secured with: Tape Dental Injury: Teeth and Oropharynx as per pre-operative assessment

## 2016-11-09 NOTE — H&P (Signed)
Joshua English  Location: [facility] Patient #: [id number] DOB: [birth year] Single / Language: Cleophus Molt / Race: White Male   History of Present Illness  The patient is a 31 year old male who presents with abdominal pain. We are asked to see the patient in consultation by Dr.Ramachandran to evaluate him for an umbilical hernia. The patient is a 31 year old male who is been experiencing some swelling and discomfort at his umbilicus for several weeks. He denies any fevers or chills. He denies any nausea or vomiting. He does take Remicade for psoriatic arthritis and is followed by a rheumatologist.   Past Surgical History  Knee Surgery  Left. Oral Surgery  Spinal Surgery Midback   Diagnostic Studies History  Colonoscopy  never  Allergies No Known Drug Allergies Allergies Reconciled   Medication History Folic Acid (1MG  Tablet, Oral) Active. Cyclobenzaprine HCl (10MG  Tablet, Oral) Active. Meloxicam (7.5MG  Tablet, Oral) Active. Methotrexate Sodium (2.5MG  Tablet, Oral) Active. Mometasone Furoate (0.1% Cream, External) Active. Medications Reconciled  Social History  Alcohol use  Occasional alcohol use. Illicit drug use  Prefer to discuss with provider. No caffeine use  Tobacco use  Never smoker.  Family History  Cancer  Mother. Colon Polyps  Father. Heart Disease  Father. Heart disease in male family member before age 87   Other Problems Arthritis  Back Pain  Other disease, cancer, significant illness  Umbilical Hernia Repair     Review of Systems  General Not Present- Appetite Loss, Chills, Fatigue, Fever, Night Sweats, Weight Gain and Weight Loss. Skin Not Present- Change in Wart/Mole, Dryness, Hives, Jaundice, New Lesions, Non-Healing Wounds, Rash and Ulcer. HEENT Present- Wears glasses/contact lenses. Not Present- Earache, Hearing Loss, Hoarseness, Nose Bleed, Oral Ulcers, Ringing in the Ears, Seasonal Allergies, Sinus Pain, Sore  Throat, Visual Disturbances and Yellow Eyes. Respiratory Not Present- Bloody sputum, Chronic Cough, Difficulty Breathing, Snoring and Wheezing. Breast Not Present- Breast Mass, Breast Pain, Nipple Discharge and Skin Changes. Cardiovascular Not Present- Chest Pain, Difficulty Breathing Lying Down, Leg Cramps, Palpitations, Rapid Heart Rate, Shortness of Breath and Swelling of Extremities. Gastrointestinal Not Present- Abdominal Pain, Bloating, Bloody Stool, Change in Bowel Habits, Chronic diarrhea, Constipation, Difficulty Swallowing, Excessive gas, Gets full quickly at meals, Hemorrhoids, Indigestion, Nausea, Rectal Pain and Vomiting. Male Genitourinary Not Present- Blood in Urine, Change in Urinary Stream, Frequency, Impotence, Nocturia, Painful Urination, Urgency and Urine Leakage. Musculoskeletal Present- Back Pain, Joint Pain and Joint Stiffness. Not Present- Muscle Pain, Muscle Weakness and Swelling of Extremities. Neurological Not Present- Decreased Memory, Fainting, Headaches, Numbness, Seizures, Tingling, Tremor, Trouble walking and Weakness. Psychiatric Not Present- Anxiety, Bipolar, Change in Sleep Pattern, Depression, Fearful and Frequent crying. Endocrine Not Present- Cold Intolerance, Excessive Hunger, Hair Changes, Heat Intolerance, Hot flashes and New Diabetes. Hematology Not Present- Blood Thinners, Easy Bruising, Excessive bleeding, Gland problems, HIV and Persistent Infections.  Vitals Weight: 201.8 lb Height: 64in Body Surface Area: 1.96 m Body Mass Index: 34.64 kg/m  Temp.: 97.50F  Pulse: 58 (Regular)  P.OX: 97% (Room air) BP: 124/84 (Sitting, Left Arm, Standard)       Physical Exam General Mental Status-Alert. General Appearance-Consistent with stated age. Hydration-Well hydrated. Voice-Normal.  Head and Neck Head-normocephalic, atraumatic with no lesions or palpable masses. Trachea-midline. Thyroid Gland Characteristics - normal size  and consistency.  Eye Eyeball - Bilateral-Extraocular movements intact. Sclera/Conjunctiva - Bilateral-No scleral icterus.  Chest and Lung Exam Chest and lung exam reveals -quiet, even and easy respiratory effort with no use of accessory muscles and on auscultation,  normal breath sounds, no adventitious sounds and normal vocal resonance. Inspection Chest Wall - Normal. Back - normal.  Cardiovascular Cardiovascular examination reveals -normal heart sounds, regular rate and rhythm with no murmurs and normal pedal pulses bilaterally.  Abdomen Note: The abdomen is soft and nontender. There is a moderate size reducible umbilical hernia. There is no sign of obstruction.   Neurologic Neurologic evaluation reveals -alert and oriented x 3 with no impairment of recent or remote memory. Mental Status-Normal.  Musculoskeletal Normal Exam - Left-Upper Extremity Strength Normal and Lower Extremity Strength Normal. Normal Exam - Right-Upper Extremity Strength Normal and Lower Extremity Strength Normal.  Lymphatic Head & Neck  General Head & Neck Lymphatics: Bilateral - Description - Normal. Axillary  General Axillary Region: Bilateral - Description - Normal. Tenderness - Non Tender. Femoral & Inguinal  Generalized Femoral & Inguinal Lymphatics: Bilateral - Description - Normal. Tenderness - Non Tender.    Assessment & Plan  UMBILICAL HERNIA WITHOUT OBSTRUCTION OR GANGRENE (K42.9) Impression: The patient appears to have a moderate size reducible umbilical hernia. Because of the risk of incarceration and strangulation think he would benefit from having the hernia fixed. He would also like to have this done. I have discussed with him in detail the risks and benefits of the operation to fix the hernia as well as some of the technical aspects including the use of mesh and he understands and wishes to proceed. We will contact his rheumatologist to discuss the timing of the  surgery given the medicines he is on which can affect his immune system and healing. Current Plans Pt Education - Hernia: discussed with patient and provided information.

## 2016-11-09 NOTE — Interval H&P Note (Signed)
History and Physical Interval Note:  11/09/2016 9:42 AM  Joshua English  has presented today for surgery, with the diagnosis of UMBILICAL HERNIA  The various methods of treatment have been discussed with the patient and family. After consideration of risks, benefits and other options for treatment, the patient has consented to  Procedure(s): HERNIA REPAIR UMBILICAL ADULT WITH MESH (N/A) INSERTION OF MESH (N/A) as a surgical intervention .  The patient's history has been reviewed, patient examined, no change in status, stable for surgery.  I have reviewed the patient's chart and labs.  Questions were answered to the patient's satisfaction.     TOTH III,Dontay Harm S

## 2016-11-10 ENCOUNTER — Encounter (HOSPITAL_COMMUNITY): Payer: Self-pay | Admitting: General Surgery

## 2016-12-08 DIAGNOSIS — L4050 Arthropathic psoriasis, unspecified: Secondary | ICD-10-CM | POA: Diagnosis not present

## 2016-12-14 DIAGNOSIS — L4050 Arthropathic psoriasis, unspecified: Secondary | ICD-10-CM | POA: Diagnosis not present

## 2016-12-14 DIAGNOSIS — L409 Psoriasis, unspecified: Secondary | ICD-10-CM | POA: Diagnosis not present

## 2016-12-14 DIAGNOSIS — Z79899 Other long term (current) drug therapy: Secondary | ICD-10-CM | POA: Diagnosis not present

## 2016-12-14 DIAGNOSIS — Z23 Encounter for immunization: Secondary | ICD-10-CM | POA: Diagnosis not present

## 2016-12-14 DIAGNOSIS — M419 Scoliosis, unspecified: Secondary | ICD-10-CM | POA: Diagnosis not present

## 2017-01-25 DIAGNOSIS — L4050 Arthropathic psoriasis, unspecified: Secondary | ICD-10-CM | POA: Diagnosis not present

## 2017-03-09 DIAGNOSIS — L4050 Arthropathic psoriasis, unspecified: Secondary | ICD-10-CM | POA: Diagnosis not present

## 2017-03-17 DIAGNOSIS — Z79899 Other long term (current) drug therapy: Secondary | ICD-10-CM | POA: Diagnosis not present

## 2017-03-17 DIAGNOSIS — M419 Scoliosis, unspecified: Secondary | ICD-10-CM | POA: Diagnosis not present

## 2017-03-17 DIAGNOSIS — L4050 Arthropathic psoriasis, unspecified: Secondary | ICD-10-CM | POA: Diagnosis not present

## 2017-03-17 DIAGNOSIS — L409 Psoriasis, unspecified: Secondary | ICD-10-CM | POA: Diagnosis not present

## 2017-04-20 DIAGNOSIS — L4050 Arthropathic psoriasis, unspecified: Secondary | ICD-10-CM | POA: Diagnosis not present

## 2017-06-07 DIAGNOSIS — L4050 Arthropathic psoriasis, unspecified: Secondary | ICD-10-CM | POA: Diagnosis not present

## 2017-06-16 DIAGNOSIS — M549 Dorsalgia, unspecified: Secondary | ICD-10-CM | POA: Diagnosis not present

## 2017-06-16 DIAGNOSIS — M5136 Other intervertebral disc degeneration, lumbar region: Secondary | ICD-10-CM | POA: Diagnosis not present

## 2017-06-16 DIAGNOSIS — L739 Follicular disorder, unspecified: Secondary | ICD-10-CM | POA: Diagnosis not present

## 2017-06-16 DIAGNOSIS — Z79899 Other long term (current) drug therapy: Secondary | ICD-10-CM | POA: Diagnosis not present

## 2017-06-16 DIAGNOSIS — L4050 Arthropathic psoriasis, unspecified: Secondary | ICD-10-CM | POA: Diagnosis not present

## 2017-06-16 DIAGNOSIS — G8929 Other chronic pain: Secondary | ICD-10-CM | POA: Diagnosis not present

## 2017-06-28 DIAGNOSIS — M4150 Other secondary scoliosis, site unspecified: Secondary | ICD-10-CM | POA: Diagnosis not present

## 2017-06-28 DIAGNOSIS — M546 Pain in thoracic spine: Secondary | ICD-10-CM | POA: Diagnosis not present

## 2017-07-13 DIAGNOSIS — M4125 Other idiopathic scoliosis, thoracolumbar region: Secondary | ICD-10-CM | POA: Diagnosis not present

## 2017-07-13 DIAGNOSIS — R531 Weakness: Secondary | ICD-10-CM | POA: Diagnosis not present

## 2017-07-13 DIAGNOSIS — L4059 Other psoriatic arthropathy: Secondary | ICD-10-CM | POA: Diagnosis not present

## 2017-07-15 DIAGNOSIS — L4059 Other psoriatic arthropathy: Secondary | ICD-10-CM | POA: Diagnosis not present

## 2017-07-15 DIAGNOSIS — M4125 Other idiopathic scoliosis, thoracolumbar region: Secondary | ICD-10-CM | POA: Diagnosis not present

## 2017-07-15 DIAGNOSIS — R531 Weakness: Secondary | ICD-10-CM | POA: Diagnosis not present

## 2017-07-19 DIAGNOSIS — R531 Weakness: Secondary | ICD-10-CM | POA: Diagnosis not present

## 2017-07-19 DIAGNOSIS — M4125 Other idiopathic scoliosis, thoracolumbar region: Secondary | ICD-10-CM | POA: Diagnosis not present

## 2017-07-19 DIAGNOSIS — L4059 Other psoriatic arthropathy: Secondary | ICD-10-CM | POA: Diagnosis not present

## 2017-07-20 DIAGNOSIS — L4050 Arthropathic psoriasis, unspecified: Secondary | ICD-10-CM | POA: Diagnosis not present

## 2017-07-22 DIAGNOSIS — R531 Weakness: Secondary | ICD-10-CM | POA: Diagnosis not present

## 2017-07-22 DIAGNOSIS — M4125 Other idiopathic scoliosis, thoracolumbar region: Secondary | ICD-10-CM | POA: Diagnosis not present

## 2017-07-22 DIAGNOSIS — L4059 Other psoriatic arthropathy: Secondary | ICD-10-CM | POA: Diagnosis not present

## 2017-07-26 DIAGNOSIS — M4125 Other idiopathic scoliosis, thoracolumbar region: Secondary | ICD-10-CM | POA: Diagnosis not present

## 2017-07-26 DIAGNOSIS — L4059 Other psoriatic arthropathy: Secondary | ICD-10-CM | POA: Diagnosis not present

## 2017-07-26 DIAGNOSIS — R531 Weakness: Secondary | ICD-10-CM | POA: Diagnosis not present

## 2017-07-29 DIAGNOSIS — M4125 Other idiopathic scoliosis, thoracolumbar region: Secondary | ICD-10-CM | POA: Diagnosis not present

## 2017-07-29 DIAGNOSIS — R531 Weakness: Secondary | ICD-10-CM | POA: Diagnosis not present

## 2017-07-29 DIAGNOSIS — L4059 Other psoriatic arthropathy: Secondary | ICD-10-CM | POA: Diagnosis not present

## 2017-08-02 DIAGNOSIS — R531 Weakness: Secondary | ICD-10-CM | POA: Diagnosis not present

## 2017-08-02 DIAGNOSIS — M4125 Other idiopathic scoliosis, thoracolumbar region: Secondary | ICD-10-CM | POA: Diagnosis not present

## 2017-08-02 DIAGNOSIS — L4059 Other psoriatic arthropathy: Secondary | ICD-10-CM | POA: Diagnosis not present

## 2017-08-05 DIAGNOSIS — L4059 Other psoriatic arthropathy: Secondary | ICD-10-CM | POA: Diagnosis not present

## 2017-08-05 DIAGNOSIS — M4125 Other idiopathic scoliosis, thoracolumbar region: Secondary | ICD-10-CM | POA: Diagnosis not present

## 2017-08-05 DIAGNOSIS — R531 Weakness: Secondary | ICD-10-CM | POA: Diagnosis not present

## 2017-08-10 DIAGNOSIS — L4059 Other psoriatic arthropathy: Secondary | ICD-10-CM | POA: Diagnosis not present

## 2017-08-10 DIAGNOSIS — R531 Weakness: Secondary | ICD-10-CM | POA: Diagnosis not present

## 2017-08-10 DIAGNOSIS — M4125 Other idiopathic scoliosis, thoracolumbar region: Secondary | ICD-10-CM | POA: Diagnosis not present

## 2017-08-23 DIAGNOSIS — M4125 Other idiopathic scoliosis, thoracolumbar region: Secondary | ICD-10-CM | POA: Diagnosis not present

## 2017-08-23 DIAGNOSIS — R531 Weakness: Secondary | ICD-10-CM | POA: Diagnosis not present

## 2017-08-23 DIAGNOSIS — L4059 Other psoriatic arthropathy: Secondary | ICD-10-CM | POA: Diagnosis not present

## 2017-08-25 DIAGNOSIS — L4059 Other psoriatic arthropathy: Secondary | ICD-10-CM | POA: Diagnosis not present

## 2017-08-25 DIAGNOSIS — M4125 Other idiopathic scoliosis, thoracolumbar region: Secondary | ICD-10-CM | POA: Diagnosis not present

## 2017-08-25 DIAGNOSIS — R531 Weakness: Secondary | ICD-10-CM | POA: Diagnosis not present

## 2017-08-30 DIAGNOSIS — L4059 Other psoriatic arthropathy: Secondary | ICD-10-CM | POA: Diagnosis not present

## 2017-08-30 DIAGNOSIS — R531 Weakness: Secondary | ICD-10-CM | POA: Diagnosis not present

## 2017-08-30 DIAGNOSIS — M4125 Other idiopathic scoliosis, thoracolumbar region: Secondary | ICD-10-CM | POA: Diagnosis not present

## 2017-09-01 DIAGNOSIS — L4050 Arthropathic psoriasis, unspecified: Secondary | ICD-10-CM | POA: Diagnosis not present

## 2017-09-02 DIAGNOSIS — R531 Weakness: Secondary | ICD-10-CM | POA: Diagnosis not present

## 2017-09-02 DIAGNOSIS — M4125 Other idiopathic scoliosis, thoracolumbar region: Secondary | ICD-10-CM | POA: Diagnosis not present

## 2017-09-02 DIAGNOSIS — L4059 Other psoriatic arthropathy: Secondary | ICD-10-CM | POA: Diagnosis not present

## 2017-09-06 DIAGNOSIS — L4059 Other psoriatic arthropathy: Secondary | ICD-10-CM | POA: Diagnosis not present

## 2017-09-06 DIAGNOSIS — M4125 Other idiopathic scoliosis, thoracolumbar region: Secondary | ICD-10-CM | POA: Diagnosis not present

## 2017-09-06 DIAGNOSIS — R531 Weakness: Secondary | ICD-10-CM | POA: Diagnosis not present

## 2017-09-09 DIAGNOSIS — L4059 Other psoriatic arthropathy: Secondary | ICD-10-CM | POA: Diagnosis not present

## 2017-09-09 DIAGNOSIS — M4125 Other idiopathic scoliosis, thoracolumbar region: Secondary | ICD-10-CM | POA: Diagnosis not present

## 2017-09-09 DIAGNOSIS — R531 Weakness: Secondary | ICD-10-CM | POA: Diagnosis not present

## 2017-09-13 DIAGNOSIS — M549 Dorsalgia, unspecified: Secondary | ICD-10-CM | POA: Diagnosis not present

## 2017-09-13 DIAGNOSIS — Z79899 Other long term (current) drug therapy: Secondary | ICD-10-CM | POA: Diagnosis not present

## 2017-09-13 DIAGNOSIS — L4050 Arthropathic psoriasis, unspecified: Secondary | ICD-10-CM | POA: Diagnosis not present

## 2017-09-14 DIAGNOSIS — L4059 Other psoriatic arthropathy: Secondary | ICD-10-CM | POA: Diagnosis not present

## 2017-09-14 DIAGNOSIS — M4125 Other idiopathic scoliosis, thoracolumbar region: Secondary | ICD-10-CM | POA: Diagnosis not present

## 2017-09-14 DIAGNOSIS — R531 Weakness: Secondary | ICD-10-CM | POA: Diagnosis not present

## 2017-09-20 DIAGNOSIS — L4059 Other psoriatic arthropathy: Secondary | ICD-10-CM | POA: Diagnosis not present

## 2017-09-20 DIAGNOSIS — R531 Weakness: Secondary | ICD-10-CM | POA: Diagnosis not present

## 2017-09-20 DIAGNOSIS — M4125 Other idiopathic scoliosis, thoracolumbar region: Secondary | ICD-10-CM | POA: Diagnosis not present

## 2017-09-23 DIAGNOSIS — R531 Weakness: Secondary | ICD-10-CM | POA: Diagnosis not present

## 2017-09-23 DIAGNOSIS — M4125 Other idiopathic scoliosis, thoracolumbar region: Secondary | ICD-10-CM | POA: Diagnosis not present

## 2017-09-23 DIAGNOSIS — L4059 Other psoriatic arthropathy: Secondary | ICD-10-CM | POA: Diagnosis not present

## 2017-09-27 DIAGNOSIS — R531 Weakness: Secondary | ICD-10-CM | POA: Diagnosis not present

## 2017-09-27 DIAGNOSIS — L4059 Other psoriatic arthropathy: Secondary | ICD-10-CM | POA: Diagnosis not present

## 2017-09-27 DIAGNOSIS — M4125 Other idiopathic scoliosis, thoracolumbar region: Secondary | ICD-10-CM | POA: Diagnosis not present

## 2017-09-29 DIAGNOSIS — L4059 Other psoriatic arthropathy: Secondary | ICD-10-CM | POA: Diagnosis not present

## 2017-09-29 DIAGNOSIS — R531 Weakness: Secondary | ICD-10-CM | POA: Diagnosis not present

## 2017-09-29 DIAGNOSIS — M546 Pain in thoracic spine: Secondary | ICD-10-CM | POA: Diagnosis not present

## 2017-09-29 DIAGNOSIS — M4125 Other idiopathic scoliosis, thoracolumbar region: Secondary | ICD-10-CM | POA: Diagnosis not present

## 2017-09-30 ENCOUNTER — Other Ambulatory Visit: Payer: Self-pay | Admitting: Orthopaedic Surgery

## 2017-09-30 DIAGNOSIS — M546 Pain in thoracic spine: Secondary | ICD-10-CM

## 2017-10-04 DIAGNOSIS — M4125 Other idiopathic scoliosis, thoracolumbar region: Secondary | ICD-10-CM | POA: Diagnosis not present

## 2017-10-04 DIAGNOSIS — R531 Weakness: Secondary | ICD-10-CM | POA: Diagnosis not present

## 2017-10-04 DIAGNOSIS — L4059 Other psoriatic arthropathy: Secondary | ICD-10-CM | POA: Diagnosis not present

## 2017-10-07 DIAGNOSIS — M4125 Other idiopathic scoliosis, thoracolumbar region: Secondary | ICD-10-CM | POA: Diagnosis not present

## 2017-10-07 DIAGNOSIS — L4059 Other psoriatic arthropathy: Secondary | ICD-10-CM | POA: Diagnosis not present

## 2017-10-07 DIAGNOSIS — R531 Weakness: Secondary | ICD-10-CM | POA: Diagnosis not present

## 2017-10-12 DIAGNOSIS — R531 Weakness: Secondary | ICD-10-CM | POA: Diagnosis not present

## 2017-10-12 DIAGNOSIS — L4059 Other psoriatic arthropathy: Secondary | ICD-10-CM | POA: Diagnosis not present

## 2017-10-12 DIAGNOSIS — M4125 Other idiopathic scoliosis, thoracolumbar region: Secondary | ICD-10-CM | POA: Diagnosis not present

## 2017-10-13 ENCOUNTER — Other Ambulatory Visit: Payer: BLUE CROSS/BLUE SHIELD

## 2017-10-13 DIAGNOSIS — L4050 Arthropathic psoriasis, unspecified: Secondary | ICD-10-CM | POA: Diagnosis not present

## 2017-10-18 ENCOUNTER — Ambulatory Visit
Admission: RE | Admit: 2017-10-18 | Discharge: 2017-10-18 | Disposition: A | Discharge status: Home / Self Care | Payer: BLUE CROSS/BLUE SHIELD | Source: Ambulatory Visit | Attending: Orthopaedic Surgery | Admitting: Orthopaedic Surgery

## 2017-10-18 DIAGNOSIS — M546 Pain in thoracic spine: Secondary | ICD-10-CM

## 2017-10-19 DIAGNOSIS — L4059 Other psoriatic arthropathy: Secondary | ICD-10-CM | POA: Diagnosis not present

## 2017-10-19 DIAGNOSIS — M4125 Other idiopathic scoliosis, thoracolumbar region: Secondary | ICD-10-CM | POA: Diagnosis not present

## 2017-10-19 DIAGNOSIS — R531 Weakness: Secondary | ICD-10-CM | POA: Diagnosis not present

## 2017-10-27 DIAGNOSIS — L4059 Other psoriatic arthropathy: Secondary | ICD-10-CM | POA: Diagnosis not present

## 2017-10-27 DIAGNOSIS — M546 Pain in thoracic spine: Secondary | ICD-10-CM | POA: Diagnosis not present

## 2017-10-27 DIAGNOSIS — M4125 Other idiopathic scoliosis, thoracolumbar region: Secondary | ICD-10-CM | POA: Diagnosis not present

## 2017-10-27 DIAGNOSIS — R531 Weakness: Secondary | ICD-10-CM | POA: Diagnosis not present

## 2017-11-02 DIAGNOSIS — R531 Weakness: Secondary | ICD-10-CM | POA: Diagnosis not present

## 2017-11-02 DIAGNOSIS — M4125 Other idiopathic scoliosis, thoracolumbar region: Secondary | ICD-10-CM | POA: Diagnosis not present

## 2017-11-02 DIAGNOSIS — L4059 Other psoriatic arthropathy: Secondary | ICD-10-CM | POA: Diagnosis not present

## 2017-11-17 DIAGNOSIS — M4125 Other idiopathic scoliosis, thoracolumbar region: Secondary | ICD-10-CM | POA: Diagnosis not present

## 2017-11-17 DIAGNOSIS — L4059 Other psoriatic arthropathy: Secondary | ICD-10-CM | POA: Diagnosis not present

## 2017-11-17 DIAGNOSIS — R531 Weakness: Secondary | ICD-10-CM | POA: Diagnosis not present

## 2017-11-18 DIAGNOSIS — Z6835 Body mass index (BMI) 35.0-35.9, adult: Secondary | ICD-10-CM | POA: Diagnosis not present

## 2017-11-18 DIAGNOSIS — M546 Pain in thoracic spine: Secondary | ICD-10-CM | POA: Diagnosis not present

## 2017-11-24 DIAGNOSIS — L4050 Arthropathic psoriasis, unspecified: Secondary | ICD-10-CM | POA: Diagnosis not present

## 2017-12-01 DIAGNOSIS — L4059 Other psoriatic arthropathy: Secondary | ICD-10-CM | POA: Diagnosis not present

## 2017-12-01 DIAGNOSIS — M4125 Other idiopathic scoliosis, thoracolumbar region: Secondary | ICD-10-CM | POA: Diagnosis not present

## 2017-12-01 DIAGNOSIS — R531 Weakness: Secondary | ICD-10-CM | POA: Diagnosis not present

## 2017-12-14 DIAGNOSIS — L409 Psoriasis, unspecified: Secondary | ICD-10-CM | POA: Diagnosis not present

## 2017-12-14 DIAGNOSIS — M549 Dorsalgia, unspecified: Secondary | ICD-10-CM | POA: Diagnosis not present

## 2017-12-14 DIAGNOSIS — L4050 Arthropathic psoriasis, unspecified: Secondary | ICD-10-CM | POA: Diagnosis not present

## 2017-12-14 DIAGNOSIS — Z23 Encounter for immunization: Secondary | ICD-10-CM | POA: Diagnosis not present

## 2017-12-15 DIAGNOSIS — R531 Weakness: Secondary | ICD-10-CM | POA: Diagnosis not present

## 2017-12-15 DIAGNOSIS — M4125 Other idiopathic scoliosis, thoracolumbar region: Secondary | ICD-10-CM | POA: Diagnosis not present

## 2017-12-15 DIAGNOSIS — L4059 Other psoriatic arthropathy: Secondary | ICD-10-CM | POA: Diagnosis not present

## 2018-01-10 DIAGNOSIS — L4050 Arthropathic psoriasis, unspecified: Secondary | ICD-10-CM | POA: Diagnosis not present

## 2018-02-22 DIAGNOSIS — L4050 Arthropathic psoriasis, unspecified: Secondary | ICD-10-CM | POA: Diagnosis not present

## 2018-03-16 DIAGNOSIS — Z79899 Other long term (current) drug therapy: Secondary | ICD-10-CM | POA: Diagnosis not present

## 2018-03-16 DIAGNOSIS — L409 Psoriasis, unspecified: Secondary | ICD-10-CM | POA: Diagnosis not present

## 2018-03-16 DIAGNOSIS — L4050 Arthropathic psoriasis, unspecified: Secondary | ICD-10-CM | POA: Diagnosis not present

## 2018-03-16 DIAGNOSIS — M549 Dorsalgia, unspecified: Secondary | ICD-10-CM | POA: Diagnosis not present

## 2018-04-05 DIAGNOSIS — L4050 Arthropathic psoriasis, unspecified: Secondary | ICD-10-CM | POA: Diagnosis not present

## 2018-05-18 DIAGNOSIS — L4050 Arthropathic psoriasis, unspecified: Secondary | ICD-10-CM | POA: Diagnosis not present

## 2018-06-29 DIAGNOSIS — L4050 Arthropathic psoriasis, unspecified: Secondary | ICD-10-CM | POA: Diagnosis not present

## 2018-07-13 DIAGNOSIS — R000 Tachycardia, unspecified: Secondary | ICD-10-CM | POA: Diagnosis not present

## 2018-07-13 DIAGNOSIS — R002 Palpitations: Secondary | ICD-10-CM | POA: Diagnosis not present

## 2018-07-13 DIAGNOSIS — L409 Psoriasis, unspecified: Secondary | ICD-10-CM | POA: Diagnosis not present

## 2018-07-13 DIAGNOSIS — Z79899 Other long term (current) drug therapy: Secondary | ICD-10-CM | POA: Diagnosis not present

## 2018-07-13 DIAGNOSIS — M549 Dorsalgia, unspecified: Secondary | ICD-10-CM | POA: Diagnosis not present

## 2018-07-13 DIAGNOSIS — L4050 Arthropathic psoriasis, unspecified: Secondary | ICD-10-CM | POA: Diagnosis not present

## 2018-08-02 DIAGNOSIS — R000 Tachycardia, unspecified: Secondary | ICD-10-CM | POA: Diagnosis not present

## 2018-08-05 DIAGNOSIS — Z7189 Other specified counseling: Secondary | ICD-10-CM | POA: Diagnosis not present

## 2018-08-05 DIAGNOSIS — R002 Palpitations: Secondary | ICD-10-CM | POA: Diagnosis not present

## 2018-08-05 DIAGNOSIS — R000 Tachycardia, unspecified: Secondary | ICD-10-CM | POA: Diagnosis not present

## 2018-08-05 DIAGNOSIS — I10 Essential (primary) hypertension: Secondary | ICD-10-CM | POA: Diagnosis not present

## 2018-08-09 DIAGNOSIS — R000 Tachycardia, unspecified: Secondary | ICD-10-CM | POA: Diagnosis not present

## 2018-08-10 DIAGNOSIS — L4050 Arthropathic psoriasis, unspecified: Secondary | ICD-10-CM | POA: Diagnosis not present

## 2018-08-26 DIAGNOSIS — I10 Essential (primary) hypertension: Secondary | ICD-10-CM | POA: Diagnosis not present

## 2018-08-26 DIAGNOSIS — R000 Tachycardia, unspecified: Secondary | ICD-10-CM | POA: Diagnosis not present

## 2018-08-26 DIAGNOSIS — Z7189 Other specified counseling: Secondary | ICD-10-CM | POA: Diagnosis not present

## 2018-09-30 ENCOUNTER — Other Ambulatory Visit (HOSPITAL_COMMUNITY): Payer: Self-pay | Admitting: *Deleted

## 2018-10-03 ENCOUNTER — Other Ambulatory Visit: Payer: Self-pay

## 2018-10-03 ENCOUNTER — Ambulatory Visit (HOSPITAL_COMMUNITY)
Admission: RE | Admit: 2018-10-03 | Discharge: 2018-10-03 | Disposition: A | Discharge status: Home / Self Care | Payer: Medicaid Other | Source: Ambulatory Visit | Attending: Rheumatology | Admitting: Rheumatology

## 2018-10-03 DIAGNOSIS — L4050 Arthropathic psoriasis, unspecified: Secondary | ICD-10-CM | POA: Diagnosis present

## 2018-10-03 MED ORDER — FAMOTIDINE 20 MG PO TABS: 40.0000 mg | ORAL_TABLET | ORAL | Status: DC

## 2018-10-03 MED ORDER — SODIUM CHLORIDE 0.9 % IV SOLN
7.0000 mg/kg | INTRAVENOUS | Status: DC
  Administered 2018-10-03: 600 mg via INTRAVENOUS
  Filled 2018-10-03: qty 60

## 2018-10-03 MED ORDER — ACETAMINOPHEN 325 MG PO TABS: 650.0000 mg | ORAL_TABLET | ORAL | Status: DC

## 2018-11-11 ENCOUNTER — Other Ambulatory Visit (HOSPITAL_COMMUNITY): Payer: Self-pay | Admitting: *Deleted

## 2018-11-14 ENCOUNTER — Ambulatory Visit (HOSPITAL_COMMUNITY)
Admission: RE | Admit: 2018-11-14 | Discharge: 2018-11-14 | Disposition: A | Discharge status: Home / Self Care | Payer: Medicaid Other | Source: Ambulatory Visit | Attending: Rheumatology | Admitting: Rheumatology

## 2018-11-14 ENCOUNTER — Other Ambulatory Visit: Payer: Self-pay

## 2018-11-14 DIAGNOSIS — L4050 Arthropathic psoriasis, unspecified: Secondary | ICD-10-CM | POA: Insufficient documentation

## 2018-11-14 MED ORDER — SODIUM CHLORIDE 0.9 % IV SOLN
7.0000 mg/kg | INTRAVENOUS | Status: DC
  Administered 2018-11-14: 600 mg via INTRAVENOUS
  Filled 2018-11-14: qty 60

## 2018-11-14 MED ORDER — ACETAMINOPHEN 325 MG PO TABS: 650.0000 mg | ORAL_TABLET | ORAL | Status: DC

## 2018-11-14 MED ORDER — FAMOTIDINE 20 MG PO TABS: 40.0000 mg | ORAL_TABLET | ORAL | Status: DC

## 2018-12-26 ENCOUNTER — Other Ambulatory Visit: Payer: Self-pay

## 2018-12-26 ENCOUNTER — Ambulatory Visit (HOSPITAL_COMMUNITY)
Admission: RE | Admit: 2018-12-26 | Discharge: 2018-12-26 | Disposition: A | Discharge status: Home / Self Care | Payer: Medicaid Other | Source: Ambulatory Visit | Attending: Rheumatology | Admitting: Rheumatology

## 2018-12-26 DIAGNOSIS — L4050 Arthropathic psoriasis, unspecified: Secondary | ICD-10-CM | POA: Insufficient documentation

## 2018-12-26 LAB — CBC WITH DIFFERENTIAL/PLATELET
Abs Immature Granulocytes: 0.03 10*3/uL (ref 0.00–0.07)
Basophils Absolute: 0.1 10*3/uL (ref 0.0–0.1)
Basophils Relative: 1 %
Eosinophils Absolute: 0.2 10*3/uL (ref 0.0–0.5)
Eosinophils Relative: 2 %
HCT: 43.7 % (ref 39.0–52.0)
Hemoglobin: 14.5 g/dL (ref 13.0–17.0)
Immature Granulocytes: 0 %
Lymphocytes Relative: 32 %
Lymphs Abs: 3 10*3/uL (ref 0.7–4.0)
MCH: 31.7 pg (ref 26.0–34.0)
MCHC: 33.2 g/dL (ref 30.0–36.0)
MCV: 95.4 fL (ref 80.0–100.0)
Monocytes Absolute: 0.9 10*3/uL (ref 0.1–1.0)
Monocytes Relative: 10 %
Neutro Abs: 5.3 10*3/uL (ref 1.7–7.7)
Neutrophils Relative %: 55 %
Platelets: 278 10*3/uL (ref 150–400)
RBC: 4.58 MIL/uL (ref 4.22–5.81)
RDW: 12.8 % (ref 11.5–15.5)
WBC: 9.4 10*3/uL (ref 4.0–10.5)
nRBC: 0 % (ref 0.0–0.2)

## 2018-12-26 LAB — LIPID PANEL
Cholesterol: 191 mg/dL (ref 0–200)
HDL: 39 mg/dL — ABNORMAL LOW (ref >40)
LDL Cholesterol: 104 mg/dL — ABNORMAL HIGH (ref 0–99)
Total CHOL/HDL Ratio: 4.9 RATIO
Triglycerides: 240 mg/dL — ABNORMAL HIGH (ref <150)
VLDL: 48 mg/dL — ABNORMAL HIGH (ref 0–40)

## 2018-12-26 LAB — FOLATE: Folate: >100 ng/mL (ref >5.9)

## 2018-12-26 MED ORDER — ACETAMINOPHEN 325 MG PO TABS
650.0000 mg | ORAL_TABLET | ORAL | Status: DC
  Administered 2018-12-26: 650 mg via ORAL

## 2018-12-26 MED ORDER — SODIUM CHLORIDE 0.9 % IV SOLN
7.0000 mg/kg | INTRAVENOUS | Status: DC
  Administered 2018-12-26: 600 mg via INTRAVENOUS
  Filled 2018-12-26: qty 60

## 2018-12-26 MED ORDER — FAMOTIDINE 20 MG PO TABS
ORAL_TABLET | ORAL | Status: AC
  Filled 2018-12-26: qty 1

## 2018-12-26 MED ORDER — FAMOTIDINE 20 MG PO TABS
40.0000 mg | ORAL_TABLET | ORAL | Status: DC
  Administered 2018-12-26: 40 mg via ORAL

## 2018-12-26 MED ORDER — ACETAMINOPHEN 325 MG PO TABS
ORAL_TABLET | ORAL | Status: AC
  Filled 2018-12-26: qty 2

## 2019-02-02 ENCOUNTER — Other Ambulatory Visit (HOSPITAL_COMMUNITY): Payer: Self-pay | Admitting: *Deleted

## 2019-02-06 ENCOUNTER — Encounter (HOSPITAL_COMMUNITY)
Admission: RE | Admit: 2019-02-06 | Discharge: 2019-02-06 | Disposition: A | Discharge status: Home / Self Care | Payer: Medicaid Other | Source: Ambulatory Visit | Attending: Rheumatology | Admitting: Rheumatology

## 2019-02-06 ENCOUNTER — Other Ambulatory Visit: Payer: Self-pay

## 2019-02-06 DIAGNOSIS — L4050 Arthropathic psoriasis, unspecified: Secondary | ICD-10-CM | POA: Insufficient documentation

## 2019-02-06 MED ORDER — FAMOTIDINE 20 MG PO TABS
ORAL_TABLET | ORAL | Status: AC
  Administered 2019-02-06: 20 mg via ORAL
  Filled 2019-02-06: qty 1

## 2019-02-06 MED ORDER — FAMOTIDINE 20 MG PO TABS: 20.0000 mg | ORAL_TABLET | Freq: Once | ORAL | Status: AC

## 2019-02-06 MED ORDER — ACETAMINOPHEN 325 MG PO TABS
ORAL_TABLET | ORAL | Status: AC
  Administered 2019-02-06: 650 mg via ORAL
  Filled 2019-02-06: qty 2

## 2019-02-06 MED ORDER — ACETAMINOPHEN 325 MG PO TABS: 650.0000 mg | ORAL_TABLET | Freq: Once | ORAL | Status: AC

## 2019-02-06 MED ORDER — SODIUM CHLORIDE 0.9 % IV SOLN
7.0000 mg/kg | INTRAVENOUS | Status: DC
  Administered 2019-02-06: 600 mg via INTRAVENOUS
  Filled 2019-02-06: qty 60

## 2019-02-15 NOTE — Progress Notes (Signed)
Virtual Visit via Video Note  I connected with Joshua English on 02/20/19 at  9:45 AM EST by a video enabled telemedicine application and verified that I am speaking with the correct person using two identifiers.  Location: Patient: Home  Provider: Clinic  This service was conducted via virtual visit.  Both audio and visual tools were used.  The patient was located at home. I was located in my office.  Consent was obtained prior to the virtual visit and is aware of possible charges through their insurance for this visit.  The patient is an established patient.  Dr. Estanislado Pandy, MD conducted the virtual visit and Hazel Sams, PA-C acted as scribe during the service.  Office staff helped with scheduling follow up visits after the service was conducted.   I discussed the limitations of evaluation and management by telemedicine and the availability of in person appointments. The patient expressed understanding and agreed to proceed.   CC: History of Present Illness: Patient is a 34 year old male with a past medical history of psoriatic arthritis and psoriasis, seen in consultation per request of his PCP.  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  He states at age 60 he started having psoriasis patches and was seen by dermatologist.  He was treated with topical agents for many years.  While he was in college he started having pain in multiple joints which he recalls having discomfort in his elbows, wrist joints, knee joints, neck and lower back.  He is to have swelling in his knee joints.  In 2013 he saw Dr. Dossie Der who started him on methotrexate and Enbrel.  He states after 2 years Enbrel quit working and he was switched to Remicade eventually.  He states he has had total 23 infusions of Remicade so far.  His last infusion was on February 06, 2019.  He states recently he has a disability was approved and his infusions are not covered by Medicaid.  The  combination of methotrexate and Remicade has been working well for him.  He is currently on methotrexate 8 tablets p.o. weekly along with folic acid 1 mg p.o. daily.  He denies any joint pain or joint swelling on the combination therapy.  He continues to have discomfort in cervical and thoracic spine for which she has been followed by Dr.Cohen.  He states he also had recent MRI of his spine.  His last Remicade infusion was on 02/06/19.  He has had 23 Remicade infusions total, but he states he was approved from Medicaid 6 months ago and remicade will not be covered.  He continues to take methotrexate 8 tablets by mouth once weekly and folic acid 1 mg daily. He would like to discuss other treatment options.  He previously had an inadequate response to Enbrel in the past (was on it for 1.5-2 year).  He has chronic neck and thoracic spinal pain.  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 He experiences morning stiffness in his back for about 30 minutes daily. He is not having any other joint pain or joint swelling currently. Denies any achilles tendonitis or plantar fasciitis.  He has no active psoriasis at this time.  He denies any eye pain or inflammation.    Review of Systems  Constitutional: Negative for fever and malaise/fatigue.  HENT: Negative for congestion.   Eyes: Negative for photophobia, pain, discharge and redness.  Respiratory: Negative for  cough, shortness of breath and wheezing.   Cardiovascular: Negative for chest pain, palpitations and leg swelling.  Gastrointestinal: Negative for blood in stool, constipation and diarrhea.  Genitourinary: Negative for dysuria and frequency.  Musculoskeletal: Positive for back pain (Thoracic pain), joint pain and neck pain. Negative for myalgias.       +Morning stiffness   Skin: Negative for rash.       Denies any active psoriasis at this time  Neurological: Negative for dizziness, weakness and headaches.   Endo/Heme/Allergies: Does not bruise/bleed easily.  Psychiatric/Behavioral: Negative for depression and memory loss. The patient is not nervous/anxious and does not have insomnia.       Observations/Objective: Physical Exam  Constitutional: He is oriented to person, place, and time and well-developed, well-nourished, and in no distress.  HENT:  Head: Normocephalic and atraumatic.  Eyes: Conjunctivae are normal.  Pulmonary/Chest: Effort normal.  Neurological: He is alert and oriented to person, place, and time.  Psychiatric: Mood, memory, affect and judgment normal.   Patient reports morning stiffness for 30 minutes  Patient reports nocturnal pain.  Difficulty dressing/grooming: Denies Difficulty climbing stairs: Denies Difficulty getting out of chair: Denies Difficulty using hands for taps, buttons, cutlery, and/or writing: Denies  Assessment and Plan: Diagnoses and all orders for this visit:  Psoriatic arthritis ([place])- Patient was diagnosed with psoriatic arthritis while he was in college.  He presented with joint pain and joint swelling and psoriasis.  He was on methotrexate and Enbrel for a few years until Enbrel stopped working for him.  He states he has been on Remicade and methotrexate combination for the last few years and the combination has been working well for him.  Recently his insurance switched to Medicaid as he went on disability and Remicade is not covered anymore.  His arthritis is very well controlled on the combination therapy.  We discussed different treatment options including Humira.  Indications side effects contraindications were discussed.  He will come in tomorrow to get lab work and to sign a consent for Humira.  He will also signed the consent for methotrexate.  He will continue methotrexate 8 tablets p.o. weekly along with folic acid 2 mg p.o. daily.  Once Humira is approved he will come in the office for the first injection.  Comments: Previous pt of Dr.  Dossie Der  Psoriasis- Patient denies having any psoriasis patches currently. Comments: Started around age of 73.  Initially treated with topical agents.  High risk medication use Comments: Remicade IV infusions every 6 wk (23 total infusions), MTX 8 tablets po once weekly, and folic acid 1 mg po daily. Inadequate response to Enbrel (1.5-81yr) Orders: -     CBC -     CMP -     QuantiFERON-TB Gold Plus -     HIV antibody -     Immunoglobulins -     Serum protein electrophoresis with reflex -     Hepatitis B surface antigen -     Hepatitis B core antibody, IgM -     Hepatitis C antibody  History of thoracic spinal fusion Comments: Congenital scoliosis, surgery at age of 62.  He continues to have cervical and thoracic pain.  He has been followed by Dr. Rennis Harding.  Essential hypertension  Other fatigue -     CK  Polyarthralgia -     CK -     ANA -     RF -     CCP -     Uric  acid    Follow Up Instructions: He will follow up in 3 to 4 weeks.   I discussed the assessment and treatment plan with the patient. The patient was provided an opportunity to ask questions and all were answered. The patient agreed with the plan and demonstrated an understanding of the instructions.   The patient was advised to call back or seek an in-person evaluation if the symptoms worsen or if the condition fails to improve as anticipated.  I provided 45 minutes of non-face-to-face time during this encounter.   Bo Merino, MD

## 2019-02-20 ENCOUNTER — Encounter: Payer: Self-pay | Admitting: Rheumatology

## 2019-02-20 ENCOUNTER — Telehealth (INDEPENDENT_AMBULATORY_CARE_PROVIDER_SITE_OTHER): Payer: Medicaid Other | Admitting: Rheumatology

## 2019-02-20 ENCOUNTER — Other Ambulatory Visit: Payer: Self-pay

## 2019-02-20 ENCOUNTER — Other Ambulatory Visit: Payer: Self-pay | Admitting: *Deleted

## 2019-02-20 ENCOUNTER — Telehealth: Payer: Self-pay | Admitting: *Deleted

## 2019-02-20 DIAGNOSIS — M2550 Pain in unspecified joint: Secondary | ICD-10-CM

## 2019-02-20 DIAGNOSIS — L4050 Arthropathic psoriasis, unspecified: Secondary | ICD-10-CM

## 2019-02-20 DIAGNOSIS — I10 Essential (primary) hypertension: Secondary | ICD-10-CM

## 2019-02-20 DIAGNOSIS — Z981 Arthrodesis status: Secondary | ICD-10-CM | POA: Diagnosis not present

## 2019-02-20 DIAGNOSIS — Z79899 Other long term (current) drug therapy: Secondary | ICD-10-CM

## 2019-02-20 DIAGNOSIS — L409 Psoriasis, unspecified: Secondary | ICD-10-CM

## 2019-02-20 DIAGNOSIS — R5383 Other fatigue: Secondary | ICD-10-CM

## 2019-02-20 DIAGNOSIS — Z9225 Personal history of immunosupression therapy: Secondary | ICD-10-CM

## 2019-02-20 NOTE — Telephone Encounter (Signed)
Received notification from [facility] regarding a prior authorization for HUMIRA. Authorization has been APPROVED from 02/20/19 to 02/20/20.   Will send document to scan center.  Authorization # [id number]

## 2019-02-20 NOTE — Patient Instructions (Signed)
Methotrexate tablets What is this medicine? METHOTREXATE (METH oh TREX ate) is a chemotherapy drug used to treat cancer including breast cancer, leukemia, and lymphoma. This medicine can also be used to treat psoriasis and certain kinds of arthritis. This medicine may be used for other purposes; ask your health care provider or pharmacist if you have questions. COMMON BRAND NAME(S): Rheumatrex, Trexall What should I tell my health care provider before I take this medicine? They need to know if you have any of these conditions:  fluid in the stomach area or lungs  if you often drink alcohol  infection or immune system problems  kidney disease or on hemodialysis  liver disease  low blood counts, like low white cell, platelet, or red cell counts  lung disease  radiation therapy  stomach ulcers  ulcerative colitis  an unusual or allergic reaction to methotrexate, other medicines, foods, dyes, or preservatives  pregnant or trying to get pregnant  breast-feeding How should I use this medicine? Take this medicine by mouth with a glass of water. Follow the directions on the prescription label. Take your medicine at regular intervals. Do not take it more often than directed. Do not stop taking except on your doctor's advice. Make sure you know why you are taking this medicine and how often you should take it. If this medicine is used for a condition that is not cancer, like arthritis or psoriasis, it should be taken weekly, NOT daily. Taking this medicine more often than directed can cause serious side effects, even death. Talk to your healthcare provider about safe handling and disposal of this medicine. You may need to take special precautions. Talk to your pediatrician regarding the use of this medicine in children. While this drug may be prescribed for selected conditions, precautions do apply. Overdosage: If you think you have taken too much of this medicine contact a poison  control center or emergency room at once. NOTE: This medicine is only for you. Do not share this medicine with others. What if I miss a dose? If you miss a dose, talk with your doctor or health care professional. Do not take double or extra doses. What may interact with this medicine? This medicine may interact with the following medication:  acitretin  aspirin and aspirin-like medicines including salicylates  azathioprine  certain antibiotics like penicillins, tetracycline, and chloramphenicol  cyclosporine  gold  hydroxychloroquine  live virus vaccines  NSAIDs, medicines for pain and inflammation, like ibuprofen or naproxen  other cytotoxic agents  penicillamine  phenylbutazone  phenytoin  probenecid  retinoids such as isotretinoin and tretinoin  steroid medicines like prednisone or cortisone  sulfonamides like sulfasalazine and trimethoprim/sulfamethoxazole  theophylline This list may not describe all possible interactions. Give your health care provider a list of all the medicines, herbs, non-prescription drugs, or dietary supplements you use. Also tell them if you smoke, drink alcohol, or use illegal drugs. Some items may interact with your medicine. What should I watch for while using this medicine? Avoid alcoholic drinks. This medicine can make you more sensitive to the sun. Keep out of the sun. If you cannot avoid being in the sun, wear protective clothing and use sunscreen. Do not use sun lamps or tanning beds/booths. You may need blood work done while you are taking this medicine. Call your doctor or health care professional for advice if you get a fever, chills or sore throat, or other symptoms of a cold or flu. Do not treat yourself. This drug decreases  your body's ability to fight infections. Try to avoid being around people who are sick. This medicine may increase your risk to bruise or bleed. Call your doctor or health care professional if you notice any  unusual bleeding. Check with your doctor or health care professional if you get an attack of severe diarrhea, nausea and vomiting, or if you sweat a lot. The loss of too much body fluid can make it dangerous for you to take this medicine. Talk to your doctor about your risk of cancer. You may be more at risk for certain types of cancers if you take this medicine. Both men and women must use effective birth control with this medicine. Do not become pregnant while taking this medicine or until at least 1 normal menstrual cycle has occurred after stopping it. Women should inform their doctor if they wish to become pregnant or think they might be pregnant. Men should not father a child while taking this medicine and for 3 months after stopping it. There is a potential for serious side effects to an unborn child. Talk to your health care professional or pharmacist for more information. Do not breast-feed an infant while taking this medicine. What side effects may I notice from receiving this medicine? Side effects that you should report to your doctor or health care professional as soon as possible:  allergic reactions like skin rash, itching or hives, swelling of the face, lips, or tongue  breathing problems or shortness of breath  diarrhea  dry, nonproductive cough  low blood counts - this medicine may decrease the number of white blood cells, red blood cells and platelets. You may be at increased risk for infections and bleeding.  mouth sores  redness, blistering, peeling or loosening of the skin, including inside the mouth  signs of infection - fever or chills, cough, sore throat, pain or trouble passing urine  signs and symptoms of bleeding such as bloody or black, tarry stools; red or dark-brown urine; spitting up blood or brown material that looks like coffee grounds; red spots on the skin; unusual bruising or bleeding from the eye, gums, or nose  signs and symptoms of kidney injury like  trouble passing urine or change in the amount of urine  signs and symptoms of liver injury like dark yellow or brown urine; general ill feeling or flu-like symptoms; light-colored stools; loss of appetite; nausea; right upper belly pain; unusually weak or tired; yellowing of the eyes or skin Side effects that usually do not require medical attention (report to your doctor or health care professional if they continue or are bothersome):  dizziness  hair loss  tiredness  upset stomach  vomiting This list may not describe all possible side effects. Call your doctor for medical advice about side effects. You may report side effects to FDA at 1-800-FDA-1088. Where should I keep my medicine? Keep out of the reach of children. Store at room temperature between 20 and 25 degrees C (68 and 77 degrees F). Protect from light. Throw away any unused medicine after the expiration date. NOTE: This sheet is a summary. It may not cover all possible information. If you have questions about this medicine, talk to your doctor, pharmacist, or health care provider.  2020 Elsevier/Gold Standard (2016-09-17 13:38:43)   Adalimumab Injection What is this medicine? ADALIMUMAB (a dal AYE mu mab) is used to treat rheumatoid and psoriatic arthritis. It is also used to treat ankylosing spondylitis, Crohn's disease, ulcerative colitis, plaque psoriasis, hidradenitis suppurativa, and  uveitis. This medicine may be used for other purposes; ask your health care provider or pharmacist if you have questions. COMMON BRAND NAME(S): CYLTEZO, Humira What should I tell my health care provider before I take this medicine? They need to know if you have any of these conditions:  diabetes  heart disease  hepatitis B or history of hepatitis B infection  immune system problems  infection or history of infections  multiple sclerosis  recently received or scheduled to receive a vaccine  scheduled to have  surgery  tuberculosis, a positive skin test for tuberculosis or have recently been in close contact with someone who has tuberculosis  an unusual reaction to adalimumab, other medicines, mannitol, latex, rubber, foods, dyes, or preservatives  pregnant or trying to get pregnant  breast-feeding How should I use this medicine? This medicine is for injection under the skin. You will be taught how to prepare and give this medicine. Use exactly as directed. Take your medicine at regular intervals. Do not take your medicine more often than directed. A special MedGuide will be given to you by the pharmacist with each prescription and refill. Be sure to read this information carefully each time. It is important that you put your used needles and syringes in a special sharps container. Do not put them in a trash can. If you do not have a sharps container, call your pharmacist or healthcare provider to get one. Talk to your pediatrician regarding the use of this medicine in children. While this drug may be prescribed for children as young as 2 years for selected conditions, precautions do apply. The manufacturer of the medicine offers free information to patients and their health care partners. Call 904-126-7588 for more information. Overdosage: If you think you have taken too much of this medicine contact a poison control center or emergency room at once. NOTE: This medicine is only for you. Do not share this medicine with others. What if I miss a dose? If you miss a dose, take it as soon as you can. If it is almost time for your next dose, take only that dose. Do not take double or extra doses. Give the next dose when your next scheduled dose is due. Call your doctor or health care professional if you are not sure how to handle a missed dose. What may interact with this medicine? Do not take this medicine with any of the following medications:  abatacept  anakinra  etanercept  infliximab  live  virus vaccines  rilonacept This medicine may also interact with the following medications:  vaccines This list may not describe all possible interactions. Give your health care provider a list of all the medicines, herbs, non-prescription drugs, or dietary supplements you use. Also tell them if you smoke, drink alcohol, or use illegal drugs. Some items may interact with your medicine. What should I watch for while using this medicine? Visit your doctor or health care professional for regular checks on your progress. Tell your doctor or healthcare professional if your symptoms do not start to get better or if they get worse. You will be tested for tuberculosis (TB) before you start this medicine. If your doctor prescribes any medicine for TB, you should start taking the TB medicine before starting this medicine. Make sure to finish the full course of TB medicine. Call your doctor or health care professional if you get a cold or other infection while receiving this medicine. Do not treat yourself. This medicine may decrease your body's  ability to fight infection. Talk to your doctor about your risk of cancer. You may be more at risk for certain types of cancers if you take this medicine. What side effects may I notice from receiving this medicine? Side effects that you should report to your doctor or health care professional as soon as possible:  allergic reactions like skin rash, itching or hives, swelling of the face, lips, or tongue  breathing problems  changes in vision  chest pain  fever, chills, or any other sign of infection  numbness or tingling  red, scaly patches or raised bumps on the skin  swelling of the ankles  swollen lymph nodes in the neck, underarm, or groin areas  unexplained weight loss  unusual bleeding or bruising  unusually weak or tired Side effects that usually do not require medical attention (report to your doctor or health care professional if they  continue or are bothersome):  headache  nausea  redness, itching, swelling, or bruising at site where injected This list may not describe all possible side effects. Call your doctor for medical advice about side effects. You may report side effects to FDA at 1-800-FDA-1088. Where should I keep my medicine? Keep out of the reach of children. Store in the original container and in the refrigerator between 2 and 8 degrees C (36 and 46 degrees F). Do not freeze. The product may be stored in a cool carrier with an ice pack, if needed. Protect from light. Throw away any unused medicine after the expiration date. NOTE: This sheet is a summary. It may not cover all possible information. If you have questions about this medicine, talk to your doctor, pharmacist, or health care provider.  2020 Elsevier/Gold Standard (2017-11-15 13:22:46)

## 2019-02-20 NOTE — Telephone Encounter (Signed)
Please schedule nurse visit

## 2019-02-20 NOTE — Telephone Encounter (Signed)
Apply for Humira please. Thank you.

## 2019-02-21 ENCOUNTER — Encounter: Payer: Self-pay | Admitting: Pharmacist

## 2019-02-21 DIAGNOSIS — L4050 Arthropathic psoriasis, unspecified: Secondary | ICD-10-CM | POA: Insufficient documentation

## 2019-02-21 DIAGNOSIS — L408 Other psoriasis: Secondary | ICD-10-CM | POA: Insufficient documentation

## 2019-02-21 DIAGNOSIS — Z79899 Other long term (current) drug therapy: Secondary | ICD-10-CM | POA: Insufficient documentation

## 2019-02-21 NOTE — Telephone Encounter (Signed)
Patient signed the consent forms for methorexate and Humira. Will send documents to scan center.   Please see note below about nurse visit. Thanks!

## 2019-02-21 NOTE — Addendum Note (Signed)
Addended by: Mariella Saa C on: 02/21/2019 08:26 AM   Modules accepted: Orders

## 2019-02-22 NOTE — Telephone Encounter (Signed)
Awaiting lab results

## 2019-02-23 LAB — URIC ACID: Uric Acid, Serum: 6.3 mg/dL (ref 4.0–8.0)

## 2019-02-23 LAB — COMPLETE METABOLIC PANEL WITH GFR
AG Ratio: 1.5 (calc) (ref 1.0–2.5)
ALT: 27 U/L (ref 9–46)
AST: 17 U/L (ref 10–40)
Albumin: 4.3 g/dL (ref 3.6–5.1)
Alkaline phosphatase (APISO): 78 U/L (ref 36–130)
BUN: 10 mg/dL (ref 7–25)
CO2: 29 mmol/L (ref 20–32)
Calcium: 9.8 mg/dL (ref 8.6–10.3)
Chloride: 101 mmol/L (ref 98–110)
Creat: 0.75 mg/dL (ref 0.60–1.35)
GFR, Est African American: 140 mL/min/{1.73_m2} (ref >=60)
GFR, Est Non African American: 121 mL/min/{1.73_m2} (ref >=60)
Globulin: 2.9 g/dL (calc) (ref 1.9–3.7)
Glucose, Bld: 115 mg/dL (ref 65–139)
Potassium: 3.9 mmol/L (ref 3.5–5.3)
Sodium: 138 mmol/L (ref 135–146)
Total Bilirubin: 0.5 mg/dL (ref 0.2–1.2)
Total Protein: 7.2 g/dL (ref 6.1–8.1)

## 2019-02-23 LAB — CBC WITH DIFFERENTIAL/PLATELET
Absolute Monocytes: 846 cells/uL (ref 200–950)
Basophils Absolute: 67 cells/uL (ref 0–200)
Basophils Relative: 0.7 %
Eosinophils Absolute: 171 cells/uL (ref 15–500)
Eosinophils Relative: 1.8 %
HCT: 41.5 % (ref 38.5–50.0)
Hemoglobin: 14.3 g/dL (ref 13.2–17.1)
Lymphs Abs: 3145 cells/uL (ref 850–3900)
MCH: 31.7 pg (ref 27.0–33.0)
MCHC: 34.5 g/dL (ref 32.0–36.0)
MCV: 92 fL (ref 80.0–100.0)
MPV: 9.7 fL (ref 7.5–12.5)
Monocytes Relative: 8.9 %
Neutro Abs: 5273 cells/uL (ref 1500–7800)
Neutrophils Relative %: 55.5 %
Platelets: 301 10*3/uL (ref 140–400)
RBC: 4.51 10*6/uL (ref 4.20–5.80)
RDW: 12.8 % (ref 11.0–15.0)
Total Lymphocyte: 33.1 %
WBC: 9.5 10*3/uL (ref 3.8–10.8)

## 2019-02-23 LAB — RHEUMATOID FACTOR: Rheumatoid fact SerPl-aCnc: <14 IU/mL (ref <14)

## 2019-02-23 LAB — HEPATITIS C ANTIBODY
Hepatitis C Ab: NONREACTIVE
SIGNAL TO CUT-OFF: 0.08 (ref <1.00)

## 2019-02-23 LAB — PROTEIN ELECTROPHORESIS, SERUM, WITH REFLEX
Albumin ELP: 4.3 g/dL (ref 3.8–4.8)
Alpha 1: 0.3 g/dL (ref 0.2–0.3)
Alpha 2: 0.6 g/dL (ref 0.5–0.9)
Beta 2: 0.3 g/dL (ref 0.2–0.5)
Beta Globulin: 0.5 g/dL (ref 0.4–0.6)
Gamma Globulin: 1.4 g/dL (ref 0.8–1.7)
Total Protein: 7.4 g/dL (ref 6.1–8.1)

## 2019-02-23 LAB — QUANTIFERON-TB GOLD PLUS
Mitogen-NIL: >10 IU/mL
NIL: 0.05 IU/mL
QuantiFERON-TB Gold Plus: NEGATIVE
TB1-NIL: 0.01 IU/mL
TB2-NIL: 0 IU/mL

## 2019-02-23 LAB — ANTI-NUCLEAR AB-TITER (ANA TITER): ANA Titer 1: 1:40 {titer} — ABNORMAL HIGH

## 2019-02-23 LAB — IGG, IGA, IGM
IgG (Immunoglobin G), Serum: 1413 mg/dL (ref 600–1640)
IgM, Serum: 291 mg/dL (ref 50–300)
Immunoglobulin A: 245 mg/dL (ref 47–310)

## 2019-02-23 LAB — HEPATITIS B SURFACE ANTIGEN: Hepatitis B Surface Ag: NONREACTIVE

## 2019-02-23 LAB — CK: Total CK: 200 U/L — ABNORMAL HIGH (ref 44–196)

## 2019-02-23 LAB — HEPATITIS B CORE ANTIBODY, IGM: Hep B C IgM: NONREACTIVE

## 2019-02-23 LAB — CYCLIC CITRUL PEPTIDE ANTIBODY, IGG: Cyclic Citrullin Peptide Ab: <16 UNITS

## 2019-02-23 LAB — ANA: Anti Nuclear Antibody (ANA): POSITIVE — AB

## 2019-02-23 LAB — HIV ANTIBODY (ROUTINE TESTING W REFLEX): HIV 1&2 Ab, 4th Generation: NONREACTIVE

## 2019-02-23 NOTE — Progress Notes (Signed)
Dr. Estanislado Pandy will discuss lab work at new patient follow up visit.

## 2019-02-28 NOTE — Addendum Note (Signed)
Addended by: Carole Binning on: 02/28/2019 04:15 PM   Modules accepted: Orders

## 2019-02-28 NOTE — Telephone Encounter (Signed)
Patient advised we need chest x-ray prior to scheduling nurse visit. Order placed and patient states he will go 02/28/18. Will schedule ince resulted.

## 2019-03-01 ENCOUNTER — Other Ambulatory Visit: Payer: Self-pay | Admitting: *Deleted

## 2019-03-01 ENCOUNTER — Ambulatory Visit (HOSPITAL_COMMUNITY)
Admission: RE | Admit: 2019-03-01 | Discharge: 2019-03-01 | Disposition: A | Discharge status: Home / Self Care | Payer: Medicaid Other | Source: Ambulatory Visit | Attending: Rheumatology | Admitting: Rheumatology

## 2019-03-01 ENCOUNTER — Other Ambulatory Visit: Payer: Self-pay

## 2019-03-01 DIAGNOSIS — T887XXA Unspecified adverse effect of drug or medicament, initial encounter: Secondary | ICD-10-CM | POA: Diagnosis not present

## 2019-03-01 DIAGNOSIS — T451X5A Adverse effect of antineoplastic and immunosuppressive drugs, initial encounter: Secondary | ICD-10-CM | POA: Diagnosis present

## 2019-03-01 NOTE — Addendum Note (Signed)
Addended by: Carole Binning on: 03/01/2019 11:23 AM   Modules accepted: Orders

## 2019-03-01 NOTE — Telephone Encounter (Signed)
Patient schedule for a new start visit for Humira on 03/07/19 at 10 am.

## 2019-03-01 NOTE — Progress Notes (Signed)
CXR , no active disease.

## 2019-03-01 NOTE — Progress Notes (Signed)
Dg  

## 2019-03-07 ENCOUNTER — Ambulatory Visit: Payer: Medicaid Other

## 2019-03-07 ENCOUNTER — Other Ambulatory Visit: Payer: Self-pay

## 2019-03-13 ENCOUNTER — Telehealth: Payer: Self-pay | Admitting: Rheumatology

## 2019-03-13 MED ORDER — METHOTREXATE SODIUM 2.5 MG PO TABS: 20.0000 mg | ORAL_TABLET | ORAL | 0 refills | Status: DC

## 2019-03-13 MED ORDER — FOLIC ACID 1 MG PO TABS: 2.0000 mg | ORAL_TABLET | Freq: Every day | ORAL | 3 refills | Status: DC

## 2019-03-13 NOTE — Telephone Encounter (Signed)
Last Visit: 02/20/19 Next Visit: 03/16/19 Labs: 02/21/19 cbc/cmp WNL  Okay to refill per Dr. Estanislado Pandy   Patient advised prescription sent to the pharmacy.

## 2019-03-13 NOTE — Telephone Encounter (Signed)
Patient calling because he is out of MTX. Patient's GP has been prescribing RX, but will not call in a refill because they do not have labs for this month. Patient had lab work after his first appointment with Dr. Keturah Barre. Can Dr. Estanislado Pandy send in rx of MTX  2.5 mg ( 8 tabs/ week) to [facility] on [address] ? Please call patient to advise. He is due for dose today.

## 2019-03-15 NOTE — Progress Notes (Signed)
Office Visit Note  Patient: Joshua English             Date of Birth: 1985-12-04           MRN: [id number]             PCP: Ferd Hibbs, NP Referring: Merrilee Seashore, MD Visit Date: 03/16/2019 Occupation: @GUAROCC @  Subjective:  Pain in neck and lower back.   History of Present Illness: Joshua English is a 34 y.o. male with history of psoriatic arthritis and psoriasis.  He states he continues to have discomfort in his neck and lower back.  He was treated with Enbrel and then Remicade in the past.  He was doing well on Remicade but it was discontinued due to insurance issues.  He has intermittent discomfort in his hands in between treatment.  He has no discomfort in his lower extremities.  He denies any active psoriasis.  The last dose of Remicade was 5 weeks ago.  Activities of Daily Living:  Patient reports morning stiffness for 30 minutes.   Patient Reports nocturnal pain.  Difficulty dressing/grooming: Denies Difficulty climbing stairs: Denies Difficulty getting out of chair: Denies Difficulty using hands for taps, buttons, cutlery, and/or writing: Denies  Review of Systems  Constitutional: Negative for fatigue, fever and night sweats.  HENT: Negative for mouth sores, mouth dryness and nose dryness.   Eyes: Negative for redness and dryness.  Respiratory: Negative for shortness of breath and difficulty breathing.   Cardiovascular: Negative for chest pain, palpitations, hypertension, irregular heartbeat and swelling in legs/feet.  Gastrointestinal: Negative for constipation and diarrhea.  Endocrine: Negative for increased urination.  Genitourinary: Negative for difficulty urinating.  Musculoskeletal: Positive for arthralgias, joint pain and morning stiffness. Negative for joint swelling, myalgias, muscle weakness, muscle tenderness and myalgias.  Skin: Negative for color change, rash, hair loss, nodules/bumps, skin tightness, ulcers and sensitivity to sunlight.    Allergic/Immunologic: Negative for susceptible to infections.  Neurological: Negative for dizziness, fainting, numbness, memory loss, night sweats and weakness ( ).  Hematological: Negative for bruising/bleeding tendency and swollen glands.  Psychiatric/Behavioral: Positive for sleep disturbance. Negative for depressed mood. The patient is not nervous/anxious.     PMFS History:  Patient Active Problem List   Diagnosis Date Noted  . Psoriatic arthritis ([place]) 02/21/2019  . Other psoriasis 02/21/2019  . High risk medication use 02/21/2019    Past Medical History:  Diagnosis Date  . Psoriatic arthritis ([place])   . Scliosis    arthritis -Psoriatic  . Scoliosis     Family History  Problem Relation Age of Onset  . Heart attack Father   . Diabetes Maternal Grandmother   . Hypertension Maternal Grandmother   . Kidney disease Maternal Grandmother    Past Surgical History:  Procedure Laterality Date  . [place]   for scoliosis  . INSERTION OF MESH N/A 11/09/2016   Procedure: INSERTION OF MESH;  Surgeon: Jovita Kussmaul, MD;  Location: [facility];  Service: General;  Laterality: N/A;  . KNEE SURGERY Left    menicus repair   . TONSILLECTOMY    . UMBILICAL HERNIA REPAIR N/A 11/09/2016   Procedure: HERNIA REPAIR UMBILICAL ADULT;  Surgeon: Jovita Kussmaul, MD;  Location: [facility];  Service: General;  Laterality: N/A;   Social History   Social History Narrative  . Not on file   Immunization History  Administered Date(s) Administered  . Influenza-Unspecified 11/09/2017  . Tdap 10/03/2015     Objective: Vital Signs:  BP 126/86 (BP Location: Right Arm, Patient Position: Sitting, Cuff Size: Normal)   Pulse 81   Resp 14   Ht 5\' 6"  (1.676 m)   Wt 194 lb 3.2 oz (88.1 kg)   BMI 31.34 kg/m    Physical Exam Vitals and nursing note reviewed.  Constitutional:      Appearance: He is well-developed.  HENT:     Head: Normocephalic and atraumatic.  Eyes:     Conjunctiva/sclera:  Conjunctivae normal.     Pupils: Pupils are equal, round, and reactive to light.  Cardiovascular:     Rate and Rhythm: Normal rate and regular rhythm.     Heart sounds: Normal heart sounds.  Pulmonary:     Effort: Pulmonary effort is normal.     Breath sounds: Normal breath sounds.  Abdominal:     General: Bowel sounds are normal.     Palpations: Abdomen is soft.  Musculoskeletal:     Cervical back: Normal range of motion and neck supple.  Skin:    General: Skin is warm and dry.     Capillary Refill: Capillary refill takes less than 2 seconds.  Neurological:     Mental Status: He is alert and oriented to person, place, and time.  Psychiatric:        Behavior: Behavior normal.      Musculoskeletal Exam: He has limited range of motion of the cervical spine.  He has severe scoliosis with fusion of his thoracic and lumbar spine.  He has some tenderness over SI joints.  Shoulder joints, elbow joints, wrist joints with good range of motion.  He has thickening of DIP joints.  No active synovitis was noted.  Hip joints and knee joints were in good range of motion with no swelling.  Ankle joints with good range of motion.  He has some DIP and PIP thickening in his feet with no active synovitis.  CDAI Exam: CDAI Score: 1.2  Patient Global: 6 mm; Provider Global: 6 mm Swollen: 0 ; Tender: 4  Joint Exam 03/16/2019      Right  Left  Cervical Spine   Tender     Lumbar Spine   Tender     Sacroiliac   Tender   Tender     Investigation: No additional findings.  Imaging: DG Chest 2 View  Result Date: 03/01/2019 CLINICAL DATA:  Medius suppressant therapy, arthritis EXAM: CHEST - 2 VIEW COMPARISON:  None. FINDINGS: Thoracic levoscoliosis status post rod and screw fixation. Heart size is normal. Lungs are clear. No airspace consolidation, pleural effusion, or pneumothorax. IMPRESSION: No active cardiopulmonary disease. Electronically Signed   By: Davina Poke D.O.   On: 03/01/2019 11:43     Recent Labs: Lab Results  Component Value Date   WBC 9.5 02/21/2019   HGB 14.3 02/21/2019   PLT 301 02/21/2019   NA 138 02/21/2019   K 3.9 02/21/2019   CL 101 02/21/2019   CO2 29 02/21/2019   GLUCOSE 115 02/21/2019   BUN 10 02/21/2019   CREATININE 0.75 02/21/2019   BILITOT 0.5 02/21/2019   AST 17 02/21/2019   ALT 27 02/21/2019   PROT 7.2 02/21/2019   PROT 7.4 02/21/2019   CALCIUM 9.8 02/21/2019   GFRAA 140 02/21/2019   QFTBGOLDPLUS NEGATIVE 02/21/2019  February 21, 2019 SPEP normal, immunoglobulins normal, TB Gold negative, hepatitis B-, hepatitis C negative, HIV negative, ANA 1: 40 homogeneous, RF negative, anti-CCP negative, uric acid 6.3, CK 200  Speciality Comments: No specialty comments available.  Procedures:  No procedures performed Allergies: Patient has no active allergies.   Assessment / Plan:     Visit Diagnoses: Psoriatic arthritis ([place]) - Diagnosed while he was in college.  Previous patient of Dr. Dossie Der.  Patient gives history of joint swelling, sacroiliitis and recurrent plantar fasciitis.  He also gives history of dactylitis.  Patient was treated with Enbrel and then Remicade.  His last infusion was about 5 weeks ago.  He states he cannot get Remicade now due to in insurance.  We discussed Humira at the last visit.  I informed consent was obtained after indications side effects contraindications were discussed.  The Humira has been approved.Marland Kitchen  He currently has no active synovitis.  He states his symptoms usually flare prior to next Remicade infusion.  He has chronic discomfort in his spine which I believe is due to underlying scoliosis and arthritis.  He was given first Humira injection in the office today and he was observed in the office for the 30 minutes.  He had no adverse events.  He will be self injecting Humira at home every other week.2q  Psoriasis-he had no active psoriasis lesions.  High risk medication use - Methotrexate 8 tablets p.o. weekly, folic  acid 2 mg p.o. daily.  Humira 40 mg subcu every other week will be started today.  (Enbrel get adequate response, Remicade discontinued due to insurance issues about 5 weeks ago.)  Pain in both hands -he has DIP thickening but no synovitis.  Plan: XR Hand 2 View Right, XR Hand 2 View Left.  X-ray of bilateral hands were consistent with osteoarthritis.  No erosive changes were noted.  Pain in both feet -he has PIP and DIP thickening.  Plan: XR Foot 2 Views Right, XR Foot 2 Views Left.  X-ray of bilateral feet were consistent with osteoarthritis.  No erosive changes were noted.  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  Plan: XR Pelvis 1-2 Views.  Bilateral SI joint narrowing almost fusion was noted.  History of thoracic spinal fusion - Congenital scoliosis, surgery at age of 10.  He continues to have cervical and thoracic pain.  He has been followed by Dr. Rennis Harding.  Essential hypertension-his blood pressure is mildly elevated.  Orders: Orders Placed This Encounter  Procedures  . XR Hand 2 View Right  . XR Hand 2 View Left  . XR Foot 2 Views Right  . XR Foot 2 Views Left  . XR Pelvis 1-2 Views   No orders of the defined types were placed in this encounter.   Face-to-face time spent with patient was 45 minutes. Greater than 50% of time was spent in counseling and coordination of care.  Follow-Up Instructions: Return in about 4 weeks (around 04/13/2019) for Psoriatic arthritis.   Bo Merino, MD  Note - This record has been created using Editor, commissioning.  Chart creation errors have been sought, but may not always  have been located. Such creation errors do not reflect on  the standard of medical care.

## 2019-03-15 NOTE — Telephone Encounter (Signed)
Opened in error

## 2019-03-16 ENCOUNTER — Ambulatory Visit: Payer: Self-pay

## 2019-03-16 ENCOUNTER — Ambulatory Visit (INDEPENDENT_AMBULATORY_CARE_PROVIDER_SITE_OTHER): Payer: Medicaid Other

## 2019-03-16 ENCOUNTER — Other Ambulatory Visit: Payer: Self-pay

## 2019-03-16 ENCOUNTER — Encounter: Payer: Self-pay | Admitting: Rheumatology

## 2019-03-16 ENCOUNTER — Ambulatory Visit: Payer: Medicaid Other | Admitting: Rheumatology

## 2019-03-16 VITALS — BP 126/86 | HR 81 | Resp 14 | Ht 66.0 in | Wt 194.2 lb

## 2019-03-16 DIAGNOSIS — M79641 Pain in right hand: Secondary | ICD-10-CM

## 2019-03-16 DIAGNOSIS — M79642 Pain in left hand: Secondary | ICD-10-CM

## 2019-03-16 DIAGNOSIS — G8929 Other chronic pain: Secondary | ICD-10-CM

## 2019-03-16 DIAGNOSIS — M79672 Pain in left foot: Secondary | ICD-10-CM

## 2019-03-16 DIAGNOSIS — M533 Sacrococcygeal disorders, not elsewhere classified: Secondary | ICD-10-CM

## 2019-03-16 DIAGNOSIS — M79671 Pain in right foot: Secondary | ICD-10-CM

## 2019-03-16 DIAGNOSIS — L409 Psoriasis, unspecified: Secondary | ICD-10-CM

## 2019-03-16 DIAGNOSIS — L4050 Arthropathic psoriasis, unspecified: Secondary | ICD-10-CM | POA: Diagnosis not present

## 2019-03-16 DIAGNOSIS — Z79899 Other long term (current) drug therapy: Secondary | ICD-10-CM

## 2019-03-16 DIAGNOSIS — I10 Essential (primary) hypertension: Secondary | ICD-10-CM

## 2019-03-16 DIAGNOSIS — Z981 Arthrodesis status: Secondary | ICD-10-CM

## 2019-03-16 MED ORDER — HUMIRA (2 PEN) 40 MG/0.4ML ~~LOC~~ AJKT: 40.0000 mg | AUTO-INJECTOR | SUBCUTANEOUS | 0 refills | Status: DC

## 2019-03-16 NOTE — Progress Notes (Signed)
Pharmacy Note Subjective: Patient presents today to [facility] for follow up office visit. Patient seen by the pharmacist for counseling on Humira for psoriatic arthritis.  He is currently taking methotrexate 8 tablets every 7 days.Prior therapy includes:methotrexate and Remicade prescribed by another provider. His last Remicade infusion 5 weeks ago.  Objective:  CBC    Component Value Date/Time   WBC 9.5 02/21/2019 1301   RBC 4.51 02/21/2019 1301   HGB 14.3 02/21/2019 1301   HCT 41.5 02/21/2019 1301   PLT 301 02/21/2019 1301   MCV 92.0 02/21/2019 1301   MCH 31.7 02/21/2019 1301   MCHC 34.5 02/21/2019 1301   RDW 12.8 02/21/2019 1301   LYMPHSABS 3,145 02/21/2019 1301   MONOABS 0.9 12/26/2018 1008   EOSABS 171 02/21/2019 1301   BASOSABS 67 02/21/2019 1301     CMP     Component Value Date/Time   NA 138 02/21/2019 1301   K 3.9 02/21/2019 1301   CL 101 02/21/2019 1301   CO2 29 02/21/2019 1301   GLUCOSE 115 02/21/2019 1301   BUN 10 02/21/2019 1301   CREATININE 0.75 02/21/2019 1301   CALCIUM 9.8 02/21/2019 1301   PROT 7.2 02/21/2019 1301   PROT 7.4 02/21/2019 1301   AST 17 02/21/2019 1301   ALT 27 02/21/2019 1301   BILITOT 0.5 02/21/2019 1301   GFRNONAA 121 02/21/2019 1301   GFRAA 140 02/21/2019 1301      Baseline Immunosuppressant Therapy Labs TB GOLD Quantiferon TB Gold Latest Ref Rng & Units 02/21/2019  Quantiferon TB Gold Plus NEGATIVE NEGATIVE   Hepatitis Panel Hepatitis Latest Ref Rng & Units 02/21/2019  Hep B Surface Ag NON-REACTI NON-REACTIVE  Hep B IgM NON-REACTI NON-REACTIVE  Hep C Ab NON-REACTI NON-REACTIVE  Hep C Ab NON-REACTI NON-REACTIVE   HIV Lab Results  Component Value Date   HIV NON-REACTIVE 02/21/2019   Immunoglobulins Immunoglobulin Electrophoresis Latest Ref Rng & Units 02/21/2019  IgA  47 - 310 mg/dL 245  IgG 600 - 1,640 mg/dL 1,413  IgM 50 - 300 mg/dL 291   SPEP Serum Protein Electrophoresis Latest Ref Rng & Units  02/21/2019  Total Protein 6.1 - 8.1 g/dL 7.4  Albumin 3.8 - 4.8 g/dL 4.3  Alpha-1 0.2 - 0.3 g/dL 0.3  Alpha-2 0.5 - 0.9 g/dL 0.6  Beta Globulin 0.4 - 0.6 g/dL 0.5  Beta 2 0.2 - 0.5 g/dL 0.3  Gamma Globulin 0.8 - 1.7 g/dL 1.4   G6PD No results found for: G6PDH TPMT No results found for: TPMT   Chest x-ray: no active cardiopulmonary disease  Does patient have diagnosis of heart failure?  No  Assessment/Plan:  Counseled patient that Humira is a TNF blocking agent.  Counseled patient on purpose, proper use, and adverse effects of Humira.  Reviewed the most common adverse effects including infections, headache, and injection site reactions. Discussed that there is the possibility of an increased risk of malignancy but it is not well understood if this increased risk is due to the medication or the disease state.  Advised patient to get yearly dermatology exams due to risk of skin cancer. Counseled patient that Humira should be held prior to scheduled surgery.  Counseled patient to avoid live vaccines while on Humira.  Advised patient to get annual influenza vaccine and the pneumococcal vaccine as indicated.    Reviewed the importance of regular labs while on Humira therapy.  Standing orders placed.  Provided patient with medication education material and answered all questions.  Patient consented  to Humira.  Will upload consent into the media tab.  Reviewed storage instructions of Humira.  Advised initial injection must be administered in office.  Patient verbalized understanding.  Dose will be for psoriatic arthritis Humira 40 mg every 14 days. Prescription sent to [facility]. Advised the pharmacy will call to set up his first shipment.  Demonstrated proper injection technique with Humira demo pen.  Patient able to demonstrate proper injection technique using the teach back method.  Patient self injected in the right anterior thigh with:  Sample Medication: Humira 40 mg/0.45ml pen NDC:  [id number] Lot: [id number] Expiration: 07/2019  Patient tolerated well.  Observed for 30 mins in office for adverse reaction and none noted. Instructed patient to call with any questions/issues.    All questions encouraged and answered.  Instructed patient to call with any questions or concerns.   Mariella Saa, PharmD, [place], [facility] Pharmacist [phone]  03/16/2019 4:10 PM

## 2019-03-16 NOTE — Patient Instructions (Signed)
Standing Labs We placed an order today for your standing lab work.    Please come back and get your standing labs in March and then every 3 months  We have open lab daily Monday through Thursday from 8:30-12:30 PM and 1:30-4:30 PM and Friday from 8:30-12:30 PM and 1:30-4:00 PM at the office of Dr. Bo Merino.   You may experience shorter wait times on Monday and Friday afternoons. The office is located at [address], [place], [place], [place] [address] No appointment is necessary.   Labs are drawn by [facility].  You may receive a bill from [facility] for your lab work.  If you wish to have your labs drawn at another location, please call the office 24 hours in advance to send orders.  If you have any questions regarding directions or hours of operation,  please call [phone].   Just as a reminder please drink plenty of water prior to coming for your lab work. Thanks!

## 2019-03-20 ENCOUNTER — Encounter (HOSPITAL_COMMUNITY): Payer: Medicaid Other

## 2019-03-22 MED FILL — HUMIRA PEN 40 MG/0.4ML PNKT: 40 | 28 days supply | Qty: 2 | Fill #0

## 2019-03-29 ENCOUNTER — Other Ambulatory Visit: Payer: Self-pay | Admitting: Rheumatology

## 2019-03-29 MED ORDER — MELOXICAM 7.5 MG PO TABS: 7.5000 mg | ORAL_TABLET | Freq: Two times a day (BID) | ORAL | 2 refills | Status: DC

## 2019-03-29 NOTE — Telephone Encounter (Signed)
ok 

## 2019-03-29 NOTE — Telephone Encounter (Signed)
Patient called requesting prescription refill of Meloxicam to be sent to [facility] on [address] in [place].

## 2019-03-29 NOTE — Telephone Encounter (Signed)
Last Visit: 03/16/19 Next Visit: 04/17/19 Labs: 02/21/19 CBC/CMP WNL  Okay to refill Meloxicam?

## 2019-04-10 NOTE — Progress Notes (Signed)
Office Visit Note  Patient: Joshua English             Date of Birth: [birth year]           MRN: [id number]             PCP: Ferd Hibbs, NP Referring: Ferd Hibbs, NP Visit Date: 04/13/2019 Occupation: @GUAROCC @  Subjective:  Medication Management   History of Present Illness: Joshua English is a 34 y.o. male with a past medical history of psoriatic arthritis and psoriasis. He is currently on Humira 40 mg injection every 14 days, Methotrexate 2.5 mg 8 tablets once weekly, and folic acid 1 mg 2 tablets by mouth daily. He is currently tolerating these medications well and has not missed any doses. He denies any recent psoriatic arthritis flares. He has recently started walking 4 miles a day and endorses increased hip pain and tightness related to the walking. He continues to experience cervical pain that occurs mostly at night and lower back pain that is constant. He states that walking helps with his lower back pain. He also endorses bilateral hand pain. He denies any other joint pain or any joint swelling at this time. He denies any recent flares of psoriasis. He also denies any recent plantar fasciitis or achilles tendinitis. He states he has occasional SI joint pain.   Activities of Daily Living:  Patient reports morning stiffness for 30 minutes.   Patient Reports nocturnal pain.  Difficulty dressing/grooming: Denies Difficulty climbing stairs: Denies Difficulty getting out of chair: Denies Difficulty using hands for taps, buttons, cutlery, and/or writing: Denies  Review of Systems  Constitutional: Negative for fatigue.  HENT: Negative for mouth sores, mouth dryness and nose dryness.   Eyes: Negative for itching and dryness.  Respiratory: Negative for shortness of breath and difficulty breathing.   Cardiovascular: Negative for chest pain and palpitations.  Gastrointestinal: Negative for blood in stool, constipation and diarrhea.  Endocrine: Negative for increased  urination.  Genitourinary: Negative for difficulty urinating and painful urination.  Musculoskeletal: Positive for arthralgias, joint pain and morning stiffness. Negative for joint swelling.  Skin: Negative for color change and rash.  Allergic/Immunologic: Negative for susceptible to infections.  Neurological: Negative for dizziness, headaches, memory loss and weakness.  Hematological: Negative for bruising/bleeding tendency.  Psychiatric/Behavioral: Negative for confusion and sleep disturbance.    PMFS History:  Patient Active Problem List   Diagnosis Date Noted  . Psoriatic arthritis ([place]) 02/21/2019  . Other psoriasis 02/21/2019  . High risk medication use 02/21/2019    Past Medical History:  Diagnosis Date  . Psoriatic arthritis ([place])   . Scliosis    arthritis -Psoriatic  . Scoliosis     Family History  Problem Relation Age of Onset  . Heart attack Father   . Diabetes Maternal Grandmother   . Hypertension Maternal Grandmother   . Kidney disease Maternal Grandmother    Past Surgical History:  Procedure Laterality Date  . [place]   for scoliosis  . INSERTION OF MESH N/A 11/09/2016   Procedure: INSERTION OF MESH;  Surgeon: Jovita Kussmaul, MD;  Location: [facility];  Service: General;  Laterality: N/A;  . KNEE SURGERY Left    menicus repair   . TONSILLECTOMY    . UMBILICAL HERNIA REPAIR N/A 11/09/2016   Procedure: HERNIA REPAIR UMBILICAL ADULT;  Surgeon: Jovita Kussmaul, MD;  Location: [facility];  Service: General;  Laterality: N/A;   Social History   Social History Narrative  .  Not on file   Immunization History  Administered Date(s) Administered  . Influenza-Unspecified 11/09/2017  . Tdap 10/03/2015     Objective: Vital Signs: BP 125/83 (BP Location: Left Arm, Patient Position: Sitting, Cuff Size: Normal)   Pulse 83   Resp 16   Ht 5\' 6"  (1.676 m)   Wt 187 lb 9.6 oz (85.1 kg)   BMI 30.28 kg/m    Physical Exam Vitals and nursing note reviewed.    Constitutional:      General: He is not in acute distress.    Appearance: Normal appearance.  HENT:     Head: Normocephalic and atraumatic.  Eyes:     Conjunctiva/sclera: Conjunctivae normal.  Cardiovascular:     Rate and Rhythm: Normal rate.  Pulmonary:     Effort: Pulmonary effort is normal.  Abdominal:     Palpations: Abdomen is soft.  Musculoskeletal:        General: No swelling or tenderness. Normal range of motion.     Cervical back: Normal range of motion. No tenderness.     Right lower leg: No edema.     Left lower leg: No edema.     Comments: Scoliosis present.  Skin:    General: Skin is warm and dry.  Neurological:     Mental Status: He is oriented to person, place, and time.  Psychiatric:        Behavior: Behavior normal.     Musculoskeletal Exam: Cervical spine, thoracic spine, and lumbar spine with good ROM and no discomfort. No midline spinal tenderness or SI joint tenderness. Severe scoliosis with fusion of his thoracic and lumbar spine. Shoulder joints, elbow joints, wrist joints, MCPs, PIPs, and DIPs with good ROM and no synovitis. Complete fist formation bilaterally. Thickening of bilateral DIP joints noted. Hip joints, knee joints, ankle joints, MTPs, and PIPs with good ROM. No warmth or effusion of bilateral knees. No tenderness or edema of bilateral ankle joints.    CDAI Exam: CDAI Score: -- Patient Global: --; Provider Global: -- Swollen: --; Tender: -- Joint Exam 04/13/2019   No joint exam has been documented for this visit   There is currently no information documented on the homunculus. Go to the Rheumatology activity and complete the homunculus joint exam.  Investigation: No additional findings.  Imaging: XR Foot 2 Views Left  Result Date: 03/16/2019 First MTP, PIP and DIP narrowing was noted.  No intertarsal, tibiotalar or subtalar joint space narrowing was noted.  No erosive changes were noted. Impression: These findings are consistent with  osteoarthritis of the foot.  XR Foot 2 Views Right  Result Date: 03/16/2019 PIP and DIP narrowing was noted.  First MTP narrowing was noted.  No intertarsal or tibiotalar joint space narrowing was noted.  Small posterior calcaneal spur was noted.  No erosive changes were noted. Impression: These findings are consistent with osteoarthritis of the foot.  XR Hand 2 View Left  Result Date: 03/16/2019 No MCP, intercarpal or radiocarpal joint space narrowing was noted.  No erosive changes were noted.  Mild DIP narrowing was noted. Impression: These findings are consistent with osteoarthritis of the hand.  XR Hand 2 View Right  Result Date: 03/16/2019 No MCP, intercarpal or radiocarpal joint space narrowing was noted.  No erosive changes were noted.  Mild DIP narrowing was noted. Impression: These findings are consistent with osteoarthritis of the hand.  XR Pelvis 1-2 Views  Result Date: 03/16/2019 Severe narrowing and sclerosis of bilateral SI joints was noted. Impression: Bilateral  SI joint narrowing was noted.  These findings can be consistent with psoriatic arthritis.   Recent Labs: Lab Results  Component Value Date   WBC 9.5 02/21/2019   HGB 14.3 02/21/2019   PLT 301 02/21/2019   NA 138 02/21/2019   K 3.9 02/21/2019   CL 101 02/21/2019   CO2 29 02/21/2019   GLUCOSE 115 02/21/2019   BUN 10 02/21/2019   CREATININE 0.75 02/21/2019   BILITOT 0.5 02/21/2019   AST 17 02/21/2019   ALT 27 02/21/2019   PROT 7.2 02/21/2019   PROT 7.4 02/21/2019   CALCIUM 9.8 02/21/2019   GFRAA 140 02/21/2019   QFTBGOLDPLUS NEGATIVE 02/21/2019    Speciality Comments: No specialty comments available.  Procedures:  No procedures performed Allergies: Patient has no active allergies.   Assessment / Plan:     Visit Diagnoses: Psoriatic arthritis ([place]) - Diagnosed while he was in college.  Previous patient of Dr. Dossie Der. He is currently on Humira 40 mg injection every 14 days, Methotrexate 2.5 mg 8 tablets  once weekly, and folic acid 1 mg 2 tablets by mouth daily. He is currently tolerating these medications well and has not missed any doses. He continues to experience cervical spine pain, lumbar spine pain, and bilateral hand pain. He denies any other joint pain or any joint swelling at this time. He has severe scoliosis with fusion of his thoracic and lumbar spine. Mild DIP thickening noted of bilateral hands. No tenderness or synovitis noted. No midline spinal tenderness or SI joint tenderness. He will continue on his current regimen of Humira, Methotrexate, and folic acid. He is up to date on routine lab work and will return in April for his next set of labs. He will follow up in 5 months.   Psoriasis - He has not had any recent psoriasis flares. No active lesions noted on exam.   High risk medication use - (Enbrel get adequate response, Remicade discontinued due to insurance issues) He is currently on Humira 40 mg injection every 14 days, Methotrexate 2.5 mg 8 tablets once weekly, and folic acid 1 mg 2 tablets by mouth daily. He is currently tolerating these medications well and has not missed any doses. His last labs were completed on 02/21/2019 and he is scheduled to return in April for routine lab work.   Primary osteoarthritis of both hands - He continues to experience bilateral hand pain. DIP thickening present on exam. No tenderness or synovitis noted.   Primary osteoarthritis of both feet - He denies any pain or swelling in his bilateral feet at this time.   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 He continues to experience occasional SI joint pain. No tenderness to palpation of SI joints on exam. He has started walking 4 miles a day recently and states that this has helped his lower back pain. Encouraged patient to continue daily exercise.   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 He continues to have cervical and  thoracic pain.  He has been followed by Dr. Rennis Harding.  Essential hypertension  Orders: Orders Placed This Encounter  Procedures  . CBC with Differential/Platelet  . COMPLETE METABOLIC PANEL WITH GFR   No orders of the defined types were placed in this encounter.     Follow-Up Instructions: Return in about 5 months (around 09/13/2019) for Rheumatoid arthritis.   Joshua Merino, MD  Note - This record has been created using  Dragon software.  Chart creation errors have been sought, but may not always  have been located. Such creation errors do not reflect on  the standard of medical care. 

## 2019-04-13 ENCOUNTER — Other Ambulatory Visit: Payer: Self-pay

## 2019-04-13 ENCOUNTER — Ambulatory Visit: Payer: Medicaid Other | Admitting: Rheumatology

## 2019-04-13 ENCOUNTER — Encounter: Payer: Self-pay | Admitting: Rheumatology

## 2019-04-13 VITALS — BP 125/83 | HR 83 | Resp 16 | Ht 66.0 in | Wt 187.6 lb

## 2019-04-13 DIAGNOSIS — M533 Sacrococcygeal disorders, not elsewhere classified: Secondary | ICD-10-CM

## 2019-04-13 DIAGNOSIS — M19072 Primary osteoarthritis, left ankle and foot: Secondary | ICD-10-CM

## 2019-04-13 DIAGNOSIS — M19041 Primary osteoarthritis, right hand: Secondary | ICD-10-CM | POA: Diagnosis not present

## 2019-04-13 DIAGNOSIS — L4050 Arthropathic psoriasis, unspecified: Secondary | ICD-10-CM

## 2019-04-13 DIAGNOSIS — L409 Psoriasis, unspecified: Secondary | ICD-10-CM

## 2019-04-13 DIAGNOSIS — M19042 Primary osteoarthritis, left hand: Secondary | ICD-10-CM

## 2019-04-13 DIAGNOSIS — Z981 Arthrodesis status: Secondary | ICD-10-CM

## 2019-04-13 DIAGNOSIS — Z79899 Other long term (current) drug therapy: Secondary | ICD-10-CM

## 2019-04-13 DIAGNOSIS — M19071 Primary osteoarthritis, right ankle and foot: Secondary | ICD-10-CM

## 2019-04-13 DIAGNOSIS — G8929 Other chronic pain: Secondary | ICD-10-CM

## 2019-04-13 DIAGNOSIS — I10 Essential (primary) hypertension: Secondary | ICD-10-CM

## 2019-04-13 NOTE — Patient Instructions (Signed)
Standing Labs We placed an order today for your standing lab work.    Please come back and get your standing labs in April and every 3 months   We have open lab daily Monday through Thursday from 8:30-12:30 PM and 1:30-4:30 PM and Friday from 8:30-12:30 PM and 1:30-4:00 PM at the office of Dr. Ndidi Nesby.   You may experience shorter wait times on Monday and Friday afternoons. The office is located at [address], [address], [place], [place] [address] No appointment is necessary.   Labs are drawn by [facility].  You may receive a bill from [facility] for your lab work.  If you wish to have your labs drawn at another location, please call the office 24 hours in advance to send orders.  If you have any questions regarding directions or hours of operation,  please call [phone].   Just as a reminder please drink plenty of water prior to coming for your lab work. Thanks!   

## 2019-04-17 ENCOUNTER — Ambulatory Visit: Payer: Medicaid Other | Admitting: Rheumatology

## 2019-04-24 MED FILL — HUMIRA PEN 40 MG/0.4ML PNKT: 40 | 28 days supply | Qty: 2 | Fill #1

## 2019-05-17 ENCOUNTER — Other Ambulatory Visit: Payer: Self-pay

## 2019-05-17 DIAGNOSIS — Z79899 Other long term (current) drug therapy: Secondary | ICD-10-CM

## 2019-05-18 LAB — COMPLETE METABOLIC PANEL WITH GFR
AG Ratio: 1.3 (calc) (ref 1.0–2.5)
ALT: 27 U/L (ref 9–46)
AST: 17 U/L (ref 10–40)
Albumin: 4 g/dL (ref 3.6–5.1)
Alkaline phosphatase (APISO): 97 U/L (ref 36–130)
BUN: 10 mg/dL (ref 7–25)
CO2: 28 mmol/L (ref 20–32)
Calcium: 9.6 mg/dL (ref 8.6–10.3)
Chloride: 103 mmol/L (ref 98–110)
Creat: 0.77 mg/dL (ref 0.60–1.35)
GFR, Est African American: 138 mL/min/{1.73_m2} (ref >=60)
GFR, Est Non African American: 119 mL/min/{1.73_m2} (ref >=60)
Globulin: 3 g/dL (calc) (ref 1.9–3.7)
Glucose, Bld: 92 mg/dL (ref 65–99)
Potassium: 4.4 mmol/L (ref 3.5–5.3)
Sodium: 139 mmol/L (ref 135–146)
Total Bilirubin: 0.7 mg/dL (ref 0.2–1.2)
Total Protein: 7 g/dL (ref 6.1–8.1)

## 2019-05-18 LAB — CBC WITH DIFFERENTIAL/PLATELET
Absolute Monocytes: 931 cells/uL (ref 200–950)
Basophils Absolute: 56 cells/uL (ref 0–200)
Basophils Relative: 0.6 %
Eosinophils Absolute: 169 cells/uL (ref 15–500)
Eosinophils Relative: 1.8 %
HCT: 41.8 % (ref 38.5–50.0)
Hemoglobin: 14.1 g/dL (ref 13.2–17.1)
Lymphs Abs: 3168 cells/uL (ref 850–3900)
MCH: 31.4 pg (ref 27.0–33.0)
MCHC: 33.7 g/dL (ref 32.0–36.0)
MCV: 93.1 fL (ref 80.0–100.0)
MPV: 9.8 fL (ref 7.5–12.5)
Monocytes Relative: 9.9 %
Neutro Abs: 5076 cells/uL (ref 1500–7800)
Neutrophils Relative %: 54 %
Platelets: 282 10*3/uL (ref 140–400)
RBC: 4.49 10*6/uL (ref 4.20–5.80)
RDW: 12.7 % (ref 11.0–15.0)
Total Lymphocyte: 33.7 %
WBC: 9.4 10*3/uL (ref 3.8–10.8)

## 2019-05-18 NOTE — Progress Notes (Signed)
CBC and CMP are normal.

## 2019-05-22 MED FILL — HUMIRA PEN 40 MG/0.4ML PNKT: 40 | 28 days supply | Qty: 2 | Fill #2

## 2019-05-29 ENCOUNTER — Other Ambulatory Visit: Payer: Self-pay | Admitting: Rheumatology

## 2019-05-30 NOTE — Telephone Encounter (Signed)
Last Visit: 04/13/2019 Next Visit: 09/14/2019 Labs: 05/17/2019 CBC and CMP are normal.  Okay to refill per Dr. Estanislado Pandy.

## 2019-06-12 ENCOUNTER — Other Ambulatory Visit: Payer: Self-pay | Admitting: Rheumatology

## 2019-06-12 DIAGNOSIS — L4050 Arthropathic psoriasis, unspecified: Secondary | ICD-10-CM

## 2019-06-12 NOTE — Telephone Encounter (Signed)
Last Visit: 04/13/2019 Next Visit: 09/14/2019 Labs: 05/17/2019 CBC and CMP are normal. TB Gold: 02/21/2019 negative   Okay to refill per Dr. Estanislado Pandy.

## 2019-06-15 MED FILL — HUMIRA PEN 40 MG/0.4ML PNKT: 40 | 28 days supply | Qty: 2 | Fill #0

## 2019-06-29 ENCOUNTER — Other Ambulatory Visit: Payer: Self-pay | Admitting: Rheumatology

## 2019-06-29 ENCOUNTER — Telehealth: Payer: Self-pay | Admitting: Rheumatology

## 2019-06-29 NOTE — Telephone Encounter (Signed)
Patient called requesting prescription refill of Meloxicam to be sent to [facility] at [address].  Patient states he took his last 2 tablets today and requesting the refill ASAP.

## 2019-06-29 NOTE — Telephone Encounter (Signed)
meloxicam prescription sent to the [facility] on [facility], patient is aware.

## 2019-06-29 NOTE — Telephone Encounter (Signed)
Last Visit: 04/13/2019 Next Visit: 09/14/2019 Labs: 05/17/2019 CBC and CMP are normal.  Okay to refill meloxicam?

## 2019-06-29 NOTE — Telephone Encounter (Signed)
Ok to refill meloxicam 

## 2019-07-13 MED FILL — HUMIRA PEN 40 MG/0.4ML PNKT: 40 | 28 days supply | Qty: 2 | Fill #1

## 2019-08-08 MED FILL — HUMIRA PEN 40 MG/0.4ML PNKT: 40 | 28 days supply | Qty: 2 | Fill #2

## 2019-08-16 ENCOUNTER — Other Ambulatory Visit: Payer: Self-pay | Admitting: Rheumatology

## 2019-08-16 NOTE — Telephone Encounter (Signed)
Last Visit: 04/13/2019 Next Visit: 09/14/2019 Labs: 05/17/2019 CBC and CMP are normal.  Current Dose per office note on 04/13/2019: Methotrexate 2.5 mg 8 tablets once weekly  Attempted to contact the patient to advise he is due to update labs. Unable to leave a message as voicemail is not set up.   Okay to refill 30 day supply MTX?

## 2019-08-17 ENCOUNTER — Other Ambulatory Visit: Payer: Self-pay

## 2019-08-17 DIAGNOSIS — Z79899 Other long term (current) drug therapy: Secondary | ICD-10-CM

## 2019-08-17 LAB — COMPLETE METABOLIC PANEL WITH GFR
AG Ratio: 1.4 (calc) (ref 1.0–2.5)
ALT: 36 U/L (ref 9–46)
AST: 18 U/L (ref 10–40)
Albumin: 4.2 g/dL (ref 3.6–5.1)
Alkaline phosphatase (APISO): 97 U/L (ref 36–130)
BUN: 13 mg/dL (ref 7–25)
CO2: 29 mmol/L (ref 20–32)
Calcium: 9.4 mg/dL (ref 8.6–10.3)
Chloride: 103 mmol/L (ref 98–110)
Creat: 0.75 mg/dL (ref 0.60–1.35)
GFR, Est African American: 139 mL/min/{1.73_m2} (ref >=60)
GFR, Est Non African American: 120 mL/min/{1.73_m2} (ref >=60)
Globulin: 2.9 g/dL (calc) (ref 1.9–3.7)
Glucose, Bld: 111 mg/dL — ABNORMAL HIGH (ref 65–99)
Potassium: 4.2 mmol/L (ref 3.5–5.3)
Sodium: 138 mmol/L (ref 135–146)
Total Bilirubin: 0.9 mg/dL (ref 0.2–1.2)
Total Protein: 7.1 g/dL (ref 6.1–8.1)

## 2019-08-17 LAB — CBC WITH DIFFERENTIAL/PLATELET
Absolute Monocytes: 809 cells/uL (ref 200–950)
Basophils Absolute: 70 cells/uL (ref 0–200)
Basophils Relative: 0.8 %
Eosinophils Absolute: 191 cells/uL (ref 15–500)
Eosinophils Relative: 2.2 %
HCT: 39.9 % (ref 38.5–50.0)
Hemoglobin: 13.7 g/dL (ref 13.2–17.1)
Lymphs Abs: 2784 cells/uL (ref 850–3900)
MCH: 32.3 pg (ref 27.0–33.0)
MCHC: 34.3 g/dL (ref 32.0–36.0)
MCV: 94.1 fL (ref 80.0–100.0)
MPV: 10.2 fL (ref 7.5–12.5)
Monocytes Relative: 9.3 %
Neutro Abs: 4846 cells/uL (ref 1500–7800)
Neutrophils Relative %: 55.7 %
Platelets: 279 10*3/uL (ref 140–400)
RBC: 4.24 10*6/uL (ref 4.20–5.80)
RDW: 12.6 % (ref 11.0–15.0)
Total Lymphocyte: 32 %
WBC: 8.7 10*3/uL (ref 3.8–10.8)

## 2019-08-31 NOTE — Progress Notes (Signed)
Office Visit Note  Patient: Joshua English             Date of Birth: August 02, 1985           MRN: [id number]             PCP: Ferd Hibbs, NP Referring: Ferd Hibbs, NP Visit Date: 09/14/2019 Occupation: @GUAROCC @  Subjective:  Medication management   History of Present Illness: Joshua English is a 34 y.o. male with history of psoriatic arthritis.  He has been doing well on Humira and methotrexate combination.  He continues to have some neck and lower back pain due to spinal fusion.  He states he has been taking meloxicam for a long time.  Since he stopped meloxicam he has been experiencing increased neck pain and lower back pain.  He denies any joint swelling.  He has no psoriasis rash currently.  Activities of Daily Living:  Patient reports morning stiffness for 1 hour.   Patient Denies nocturnal pain.  Difficulty dressing/grooming: Reports Difficulty climbing stairs: Denies Difficulty getting out of chair: Denies Difficulty using hands for taps, buttons, cutlery, and/or writing: Denies  Review of Systems  Constitutional: Negative for fatigue.  HENT: Negative for mouth dryness.   Eyes: Negative for dryness.  Respiratory: Negative for shortness of breath.   Cardiovascular: Negative for swelling in legs/feet.  Gastrointestinal: Negative for constipation.  Endocrine: Negative for excessive thirst.  Genitourinary: Negative for difficulty urinating.  Musculoskeletal: Positive for arthralgias, joint pain, joint swelling, muscle weakness, morning stiffness and muscle tenderness.  Skin: Negative for rash.  Allergic/Immunologic: Negative for susceptible to infections.  Neurological: Positive for numbness and weakness.  Hematological: Negative for bruising/bleeding tendency.  Psychiatric/Behavioral: Positive for sleep disturbance.    PMFS History:  Patient Active Problem List   Diagnosis Date Noted  . Psoriatic arthritis ([place]) 02/21/2019  . Other psoriasis 02/21/2019  .  High risk medication use 02/21/2019    Past Medical History:  Diagnosis Date  . Psoriatic arthritis ([place])   . Scliosis    arthritis -Psoriatic  . Scoliosis     Family History  Problem Relation Age of Onset  . Heart attack Father   . Diabetes Maternal Grandmother   . Hypertension Maternal Grandmother   . Kidney disease Maternal Grandmother    Past Surgical History:  Procedure Laterality Date  . [place]   for scoliosis  . INSERTION OF MESH N/A 11/09/2016   Procedure: INSERTION OF MESH;  Surgeon: Jovita Kussmaul, MD;  Location: [place];  Service: General;  Laterality: N/A;  . KNEE SURGERY Left    menicus repair   . TONSILLECTOMY    . UMBILICAL HERNIA REPAIR N/A 11/09/2016   Procedure: HERNIA REPAIR UMBILICAL ADULT;  Surgeon: Jovita Kussmaul, MD;  Location: [place];  Service: General;  Laterality: N/A;   Social History   Social History Narrative  . Not on file   Immunization History  Administered Date(s) Administered  . Influenza-Unspecified 11/09/2017  . PFIZER SARS-COV-2 Vaccination 03/13/2019, 04/17/2019  . Tdap 10/03/2015     Objective: Vital Signs: BP 117/78 (BP Location: Left Arm, Patient Position: Sitting, Cuff Size: Normal)   Pulse 84   Resp 14   Ht 5\' 6"  (1.676 m)   Wt 185 lb 12.8 oz (84.3 kg)   BMI 29.99 kg/m    Physical Exam Vitals and nursing note reviewed.  Constitutional:      Appearance: He is well-developed.  HENT:     Head: Normocephalic and  atraumatic.  Eyes:     Conjunctiva/sclera: Conjunctivae normal.     Pupils: Pupils are equal, round, and reactive to light.  Cardiovascular:     Rate and Rhythm: Normal rate and regular rhythm.     Heart sounds: Normal heart sounds.  Pulmonary:     Effort: Pulmonary effort is normal.     Breath sounds: Normal breath sounds.  Abdominal:     General: Bowel sounds are normal.     Palpations: Abdomen is soft.  Musculoskeletal:     Cervical back: Normal range of motion and neck supple.  Skin:     General: Skin is warm and dry.     Capillary Refill: Capillary refill takes less than 2 seconds.  Neurological:     Mental Status: He is alert and oriented to person, place, and time.  Psychiatric:        Behavior: Behavior normal.      Musculoskeletal Exam: He is good range of motion of his cervical spine.  Thoracic and lumbar spine is fused.  Shoulder joints, elbow joints, wrist joints, MCPs PIPs and DIPs with good range of motion with no synovitis.  Hip joints, knee joints and ankles with good range of motion with no synovitis.  CDAI Exam: CDAI Score: -- Patient Global: --; Provider Global: -- Swollen: --; Tender: -- Joint Exam 09/14/2019   No joint exam has been documented for this visit   There is currently no information documented on the homunculus. Go to the Rheumatology activity and complete the homunculus joint exam.  Investigation: No additional findings.  Imaging: No results found.  Recent Labs: Lab Results  Component Value Date   WBC 8.7 08/17/2019   HGB 13.7 08/17/2019   PLT 279 08/17/2019   NA 138 08/17/2019   K 4.2 08/17/2019   CL 103 08/17/2019   CO2 29 08/17/2019   GLUCOSE 111 (H) 08/17/2019   BUN 13 08/17/2019   CREATININE 0.75 08/17/2019   BILITOT 0.9 08/17/2019   AST 18 08/17/2019   ALT 36 08/17/2019   PROT 7.1 08/17/2019   CALCIUM 9.4 08/17/2019   GFRAA 139 08/17/2019   QFTBGOLDPLUS NEGATIVE 02/21/2019    Speciality Comments: No specialty comments available.  Procedures:  No procedures performed Allergies: Patient has no active allergies.   Assessment / Plan:     Visit Diagnoses: Psoriatic arthritis ([place]) - Diagnosed while he was in college.  Previous patient of Dr. Dossie Der.  (Enbrel get adequate response, Remicade discontinued due to insurance issues).  He is doing very well on methotrexate and Humira combination.  His labs have been stable.  He has no synovitis on examination today.  Psoriasis-he has no active psoriasis lesions.  High  risk medication use - Humira 40 mg injection every 14 days, Methotrexate 2.5 mg 8 tablets once weekly, and folic acid 1 mg 2 tablets by mouth daily.  His labs are due in October and then every 3 months to monitor for drug toxicity.  He had COVID-19 vaccination in February.  I have advised him to continue to wear mask and practice social distancing and handwashing.  In case he develops COVID-19 infection he will need Covid antibody infusion.  I have also given him instructions to delay methotrexate by 1 week when he gets booster for COVID-19.  Primary osteoarthritis of both hands-doing well.  Primary osteoarthritis of both feet-doing well.  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  History of thoracic spinal fusion - Congenital  scoliosis, surgery at age of 69. He continues to have cervical and thoracic pain.  He has been followed by Dr. Rennis Harding.  He was on meloxicam 7.5 mg p.o. daily.  We advised him to come off the medication.  He has been experiencing somewhat increased pain.  Have advised him to try Tylenol.  If he does not tolerate it well then we can call in meloxicam again.  His LFTs and creatinine has been stable.  Essential hypertension-within normal limits.  Orders: No orders of the defined types were placed in this encounter.  Meds ordered this encounter  Medications  . folic acid (FOLVITE) 1 MG tablet    Sig: Take 2 tablets (2 mg total) by mouth daily.    Dispense:  180 tablet    Refill:  3     Follow-Up Instructions: Return in about 5 months (around 02/14/2020) for Psoriatic arthritis, Osteoarthritis.   Bo Merino, MD  Note - This record has been created using Editor, commissioning.  Chart creation errors have been sought, but may not always  have been located. Such creation errors do not reflect on  the standard of medical care.

## 2019-09-04 ENCOUNTER — Other Ambulatory Visit: Payer: Self-pay | Admitting: Rheumatology

## 2019-09-04 DIAGNOSIS — L4050 Arthropathic psoriasis, unspecified: Secondary | ICD-10-CM

## 2019-09-04 NOTE — Telephone Encounter (Signed)
Last Visit: 04/13/2019 Next Visit: 09/14/2019 Labs: 08/17/2019 Glucose is 111. Rest of CMP WNL. CBC WNL.  TB Gold: 02/21/2019 Neg   Current Dose per office note on 04/13/2019: Humira 40 mg injection every 14 days  Okay to refill per Dr. Estanislado Pandy

## 2019-09-08 ENCOUNTER — Telehealth: Payer: Self-pay | Admitting: *Deleted

## 2019-09-08 ENCOUNTER — Telehealth: Payer: Self-pay | Admitting: Pharmacy Technician

## 2019-09-08 ENCOUNTER — Telehealth: Payer: Self-pay | Admitting: Rheumatology

## 2019-09-08 NOTE — Telephone Encounter (Signed)
Submitted a Prior Authorization request to [facility] for [place] via Cover My Meds. Will update once we receive a response.   (KeyMarland Kitchen [id number]) - [id number]

## 2019-09-08 NOTE — Telephone Encounter (Addendum)
Received a fax from [facility] for a drug to drug interaction for Methotrexate and Meloxicam. Reviewed by Dr. Estanislado Pandy  Recommendation is to discontinue Meloxicam.   Patient advised to discontinue Meloxicam.

## 2019-09-08 NOTE — Telephone Encounter (Signed)
Patient left a voicemail stating he received a call from his Humira representative letting him know they haven't heard back from Dr. Estanislado English so they cannot fill his prescription.   Patient requested a return call.

## 2019-09-08 NOTE — Telephone Encounter (Signed)
Patient advised prescription for Humira was sent to the pharmacy on 09/04/2019.

## 2019-09-11 MED FILL — HUMIRA PEN 40 MG/0.4ML PNKT: 40 | 28 days supply | Qty: 2 | Fill #0

## 2019-09-11 NOTE — Telephone Encounter (Signed)
Received notification from [facility] regarding a prior authorization for [place]. Authorization has been APPROVED from 09/08/19 to 09/07/20.   Authorization # [id number] Phone # [phone]

## 2019-09-14 ENCOUNTER — Ambulatory Visit: Payer: Medicaid Other | Admitting: Rheumatology

## 2019-09-14 ENCOUNTER — Other Ambulatory Visit: Payer: Self-pay

## 2019-09-14 ENCOUNTER — Encounter: Payer: Self-pay | Admitting: Rheumatology

## 2019-09-14 VITALS — BP 117/78 | HR 84 | Resp 14 | Ht 66.0 in | Wt 185.8 lb

## 2019-09-14 DIAGNOSIS — Z981 Arthrodesis status: Secondary | ICD-10-CM

## 2019-09-14 DIAGNOSIS — G8929 Other chronic pain: Secondary | ICD-10-CM

## 2019-09-14 DIAGNOSIS — Z79899 Other long term (current) drug therapy: Secondary | ICD-10-CM

## 2019-09-14 DIAGNOSIS — M19042 Primary osteoarthritis, left hand: Secondary | ICD-10-CM

## 2019-09-14 DIAGNOSIS — M19041 Primary osteoarthritis, right hand: Secondary | ICD-10-CM

## 2019-09-14 DIAGNOSIS — M19071 Primary osteoarthritis, right ankle and foot: Secondary | ICD-10-CM

## 2019-09-14 DIAGNOSIS — L4050 Arthropathic psoriasis, unspecified: Secondary | ICD-10-CM

## 2019-09-14 DIAGNOSIS — L409 Psoriasis, unspecified: Secondary | ICD-10-CM | POA: Diagnosis not present

## 2019-09-14 DIAGNOSIS — M533 Sacrococcygeal disorders, not elsewhere classified: Secondary | ICD-10-CM

## 2019-09-14 DIAGNOSIS — I10 Essential (primary) hypertension: Secondary | ICD-10-CM

## 2019-09-14 DIAGNOSIS — M19072 Primary osteoarthritis, left ankle and foot: Secondary | ICD-10-CM

## 2019-09-14 MED ORDER — FOLIC ACID 1 MG PO TABS: 2.0000 mg | ORAL_TABLET | Freq: Every day | ORAL | 3 refills | Status: DC

## 2019-09-14 NOTE — Patient Instructions (Addendum)

## 2019-09-19 ENCOUNTER — Other Ambulatory Visit: Payer: Self-pay | Admitting: Physician Assistant

## 2019-09-19 NOTE — Telephone Encounter (Signed)
Last Visit: 09/14/2019 °Next Visit: 02/14/2020 °Labs: 08/17/2019 Glucose is 111. Rest of CMP WNL.  CBC WNL.  °  °Current Dose per office note on 09/14/2019: Methotrexate 2.5 mg 8 tablets once weekly  °  °Okay to refill per Dr. Deveshwar  °

## 2019-10-09 MED FILL — HUMIRA PEN 40 MG/0.4ML PNKT: 40 | 28 days supply | Qty: 2 | Fill #1

## 2019-10-17 ENCOUNTER — Other Ambulatory Visit: Payer: Self-pay | Admitting: Rheumatology

## 2019-10-17 NOTE — Telephone Encounter (Signed)
Last Visit: 09/14/2019 Next Visit: 02/14/2020 Labs: 08/17/2019 Glucose is 111. Rest of CMP WNL. CBC WNL.   Current Dose per office note on 09/14/2019: Methotrexate 2.5 mg 8 tablets once weekly   Okay to refill per Dr. Estanislado Pandy

## 2019-11-06 MED FILL — HUMIRA PEN 40 MG/0.4ML PNKT: 40 | 28 days supply | Qty: 2 | Fill #2

## 2019-11-13 ENCOUNTER — Other Ambulatory Visit: Payer: Self-pay | Admitting: Rheumatology

## 2019-11-13 NOTE — Telephone Encounter (Signed)
Last Visit: 09/14/2019 Next Visit: 02/14/2020 Labs: 08/17/2019 Glucose is 111. Rest of CMP WNL. CBC WNL.   Current Dose per office note on 09/14/2019: Methotrexate 2.5 mg 8 tablets once weekly   Patient advised he is due to update labs. Patient will try to update them this week.   Okay to refill 30 day supply MTX?

## 2019-11-14 ENCOUNTER — Other Ambulatory Visit: Payer: Self-pay

## 2019-11-14 DIAGNOSIS — Z79899 Other long term (current) drug therapy: Secondary | ICD-10-CM

## 2019-11-15 LAB — COMPLETE METABOLIC PANEL WITH GFR
AG Ratio: 1.5 (calc) (ref 1.0–2.5)
ALT: 27 U/L (ref 9–46)
AST: 17 U/L (ref 10–40)
Albumin: 4.1 g/dL (ref 3.6–5.1)
Alkaline phosphatase (APISO): 87 U/L (ref 36–130)
BUN: 11 mg/dL (ref 7–25)
CO2: 27 mmol/L (ref 20–32)
Calcium: 9.6 mg/dL (ref 8.6–10.3)
Chloride: 102 mmol/L (ref 98–110)
Creat: 0.68 mg/dL (ref 0.60–1.35)
GFR, Est African American: 144 mL/min/{1.73_m2} (ref >=60)
GFR, Est Non African American: 125 mL/min/{1.73_m2} (ref >=60)
Globulin: 2.8 g/dL (calc) (ref 1.9–3.7)
Glucose, Bld: 113 mg/dL — ABNORMAL HIGH (ref 65–99)
Potassium: 3.8 mmol/L (ref 3.5–5.3)
Sodium: 140 mmol/L (ref 135–146)
Total Bilirubin: 0.6 mg/dL (ref 0.2–1.2)
Total Protein: 6.9 g/dL (ref 6.1–8.1)

## 2019-11-15 LAB — CBC WITH DIFFERENTIAL/PLATELET
Absolute Monocytes: 884 cells/uL (ref 200–950)
Basophils Absolute: 56 cells/uL (ref 0–200)
Basophils Relative: 0.6 %
Eosinophils Absolute: 179 cells/uL (ref 15–500)
Eosinophils Relative: 1.9 %
HCT: 41.5 % (ref 38.5–50.0)
Hemoglobin: 13.7 g/dL (ref 13.2–17.1)
Lymphs Abs: 3158 cells/uL (ref 850–3900)
MCH: 31 pg (ref 27.0–33.0)
MCHC: 33 g/dL (ref 32.0–36.0)
MCV: 93.9 fL (ref 80.0–100.0)
MPV: 10.2 fL (ref 7.5–12.5)
Monocytes Relative: 9.4 %
Neutro Abs: 5123 cells/uL (ref 1500–7800)
Neutrophils Relative %: 54.5 %
Platelets: 282 10*3/uL (ref 140–400)
RBC: 4.42 10*6/uL (ref 4.20–5.80)
RDW: 12.3 % (ref 11.0–15.0)
Total Lymphocyte: 33.6 %
WBC: 9.4 10*3/uL (ref 3.8–10.8)

## 2019-11-28 ENCOUNTER — Other Ambulatory Visit: Payer: Self-pay | Admitting: Rheumatology

## 2019-11-28 DIAGNOSIS — L4050 Arthropathic psoriasis, unspecified: Secondary | ICD-10-CM

## 2019-11-28 NOTE — Telephone Encounter (Signed)
Last Visit: 09/14/2019 Next Visit: 02/14/2020 Labs: 11/14/2019 Glucose is 113. Rest of CMP WNL. CBC WNL.  TB Gold: 02/21/2019 Neg   Current Dose per office note on 09/14/2019: Humira 40 mg injection every 14 days Dx: Psoriatic arthritis   Okay to refill per Dr. Estanislado Pandy

## 2019-11-30 MED FILL — HUMIRA PEN 40 MG/0.4ML PNKT: 40 | 28 days supply | Qty: 2 | Fill #0

## 2019-12-03 IMAGING — MR MR THORACIC SPINE W/O CM
4 of 6 series · 15 of 48 positions shown · non-contrast
Comparison: Plain films thoracic spine 05/13/2015.

CLINICAL DATA: Chronic thoracic spine pain. History of prior
thoracic fusion for scoliosis.

EXAM:
MRI THORACIC SPINE WITHOUT CONTRAST
TECHNIQUE: Multiplanar, multisequence MR imaging of the thoracic spine was
performed. No intravenous contrast was administered.

[Series 5: T2 · sagittal · 3.0mm · 0.56mm/px · 6 of 34 slices shown (1 of 3)]
[im 1/34]
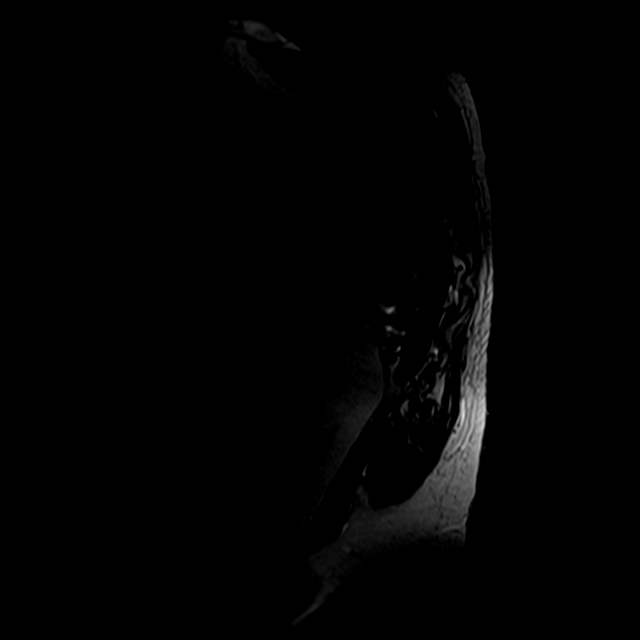
[im 6/34]
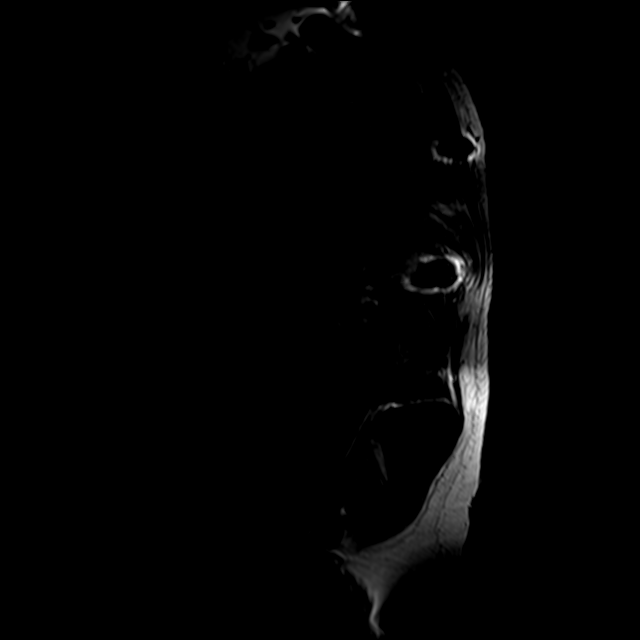
[im 12/34]
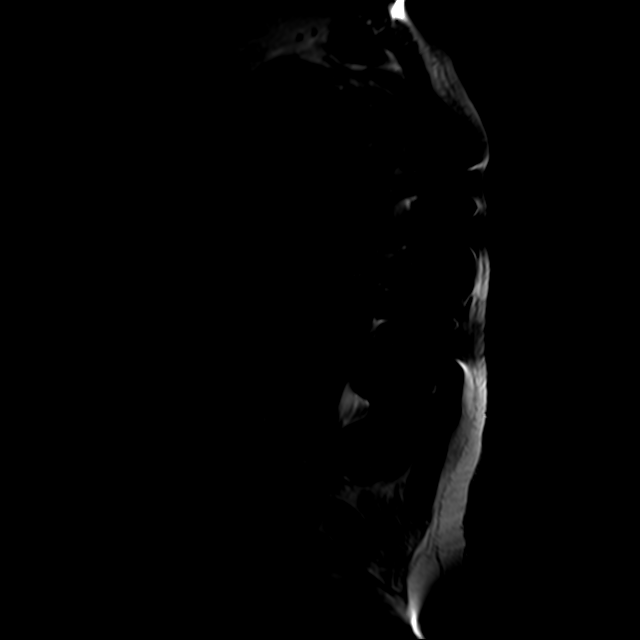
[im 17/34]
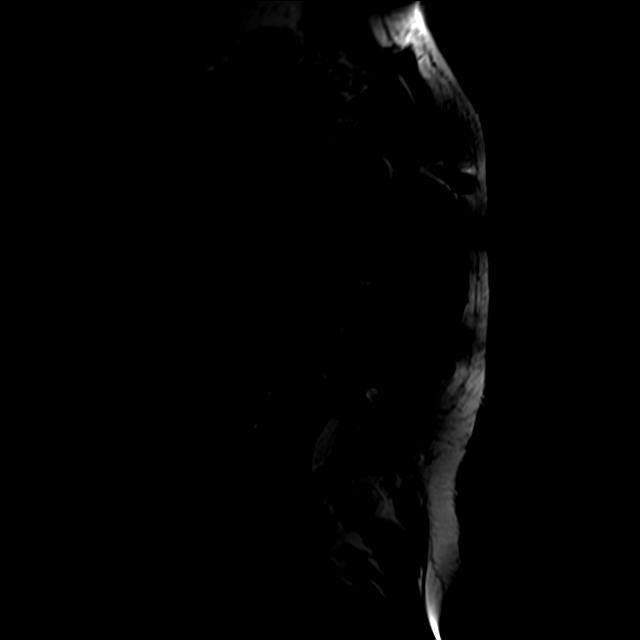
[im 23/34]
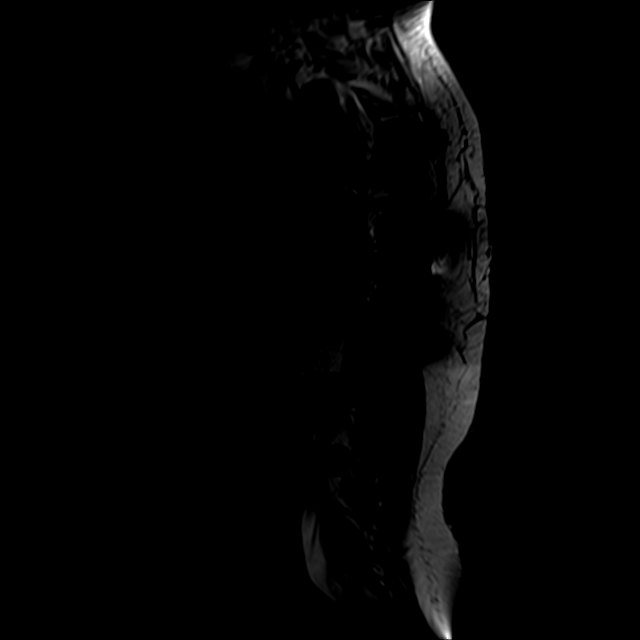
[im 28/34]
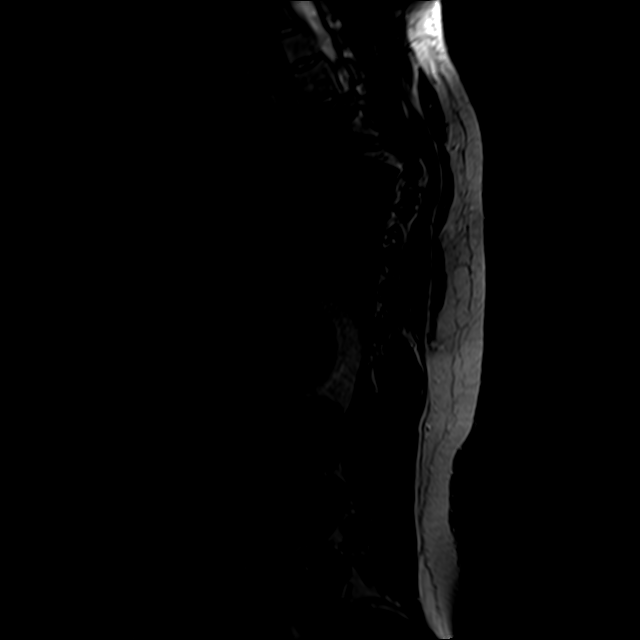

[Series 6: T2 · coronal · 3.0mm · 0.70mm/px · 3 of 34 slices shown (2 of 3)]
[im 6/34]
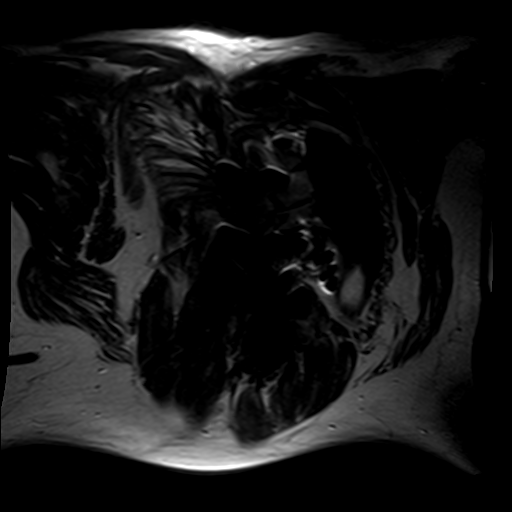
[im 17/34]
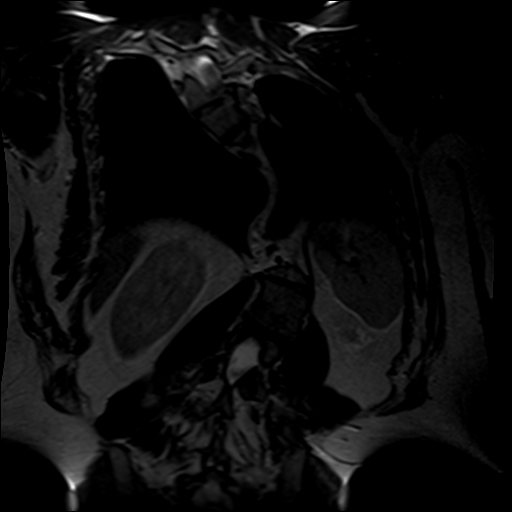
[im 28/34]
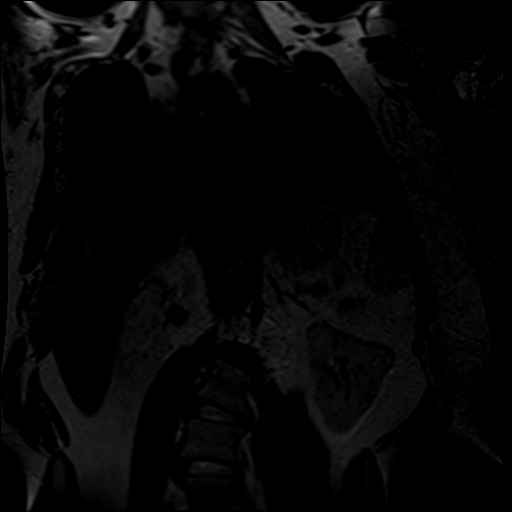

[Series 7: T1 · sagittal · 3.0mm · 0.56mm/px · 3 of 34 slices shown]
[im 6/34]
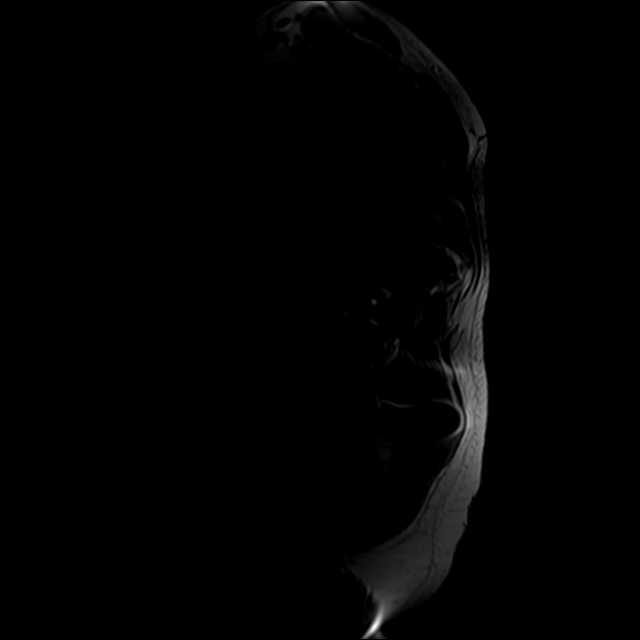
[im 17/34]
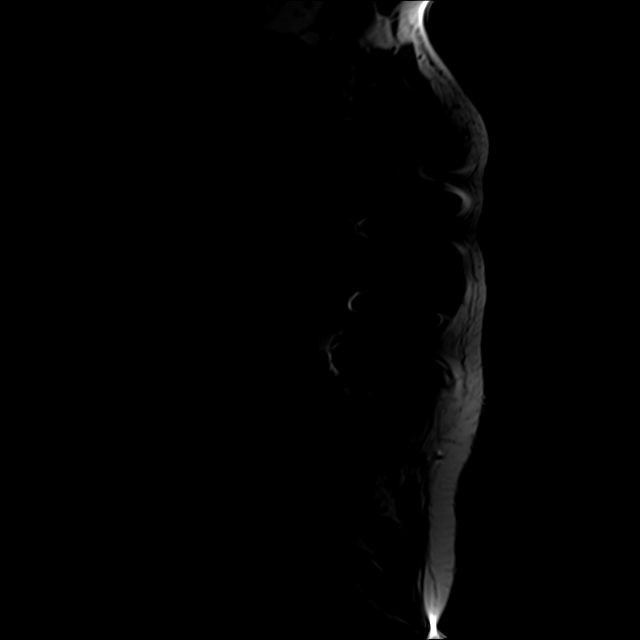
[im 28/34]
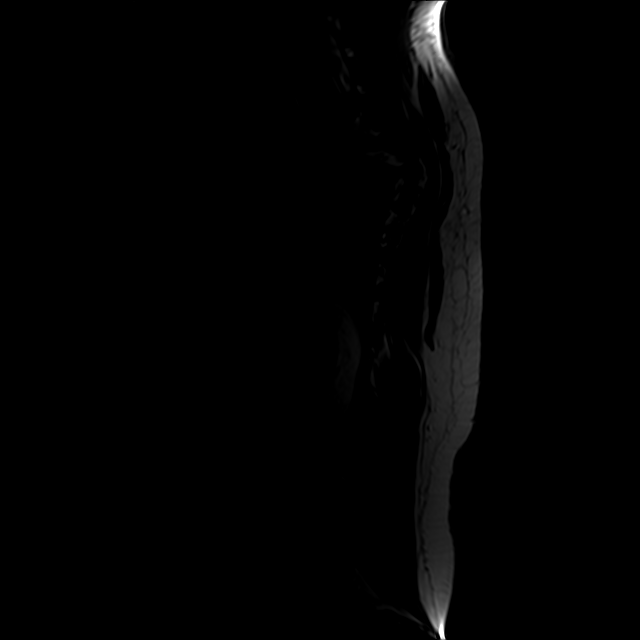

[Series 9: T2 · axial · 4.0mm · 0.39mm/px · z∈[-268,-63]mm · 3 of 50 slices shown (3 of 3)]
[im 6/50]
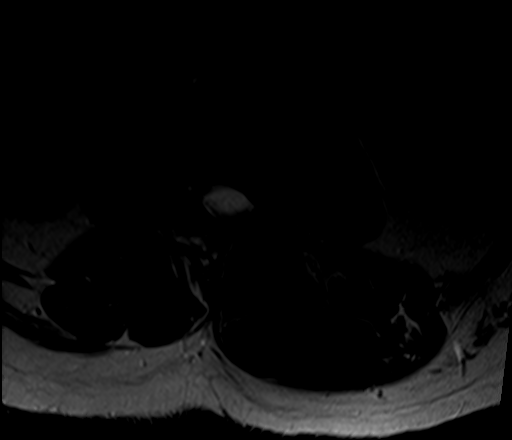
[im 28/50]
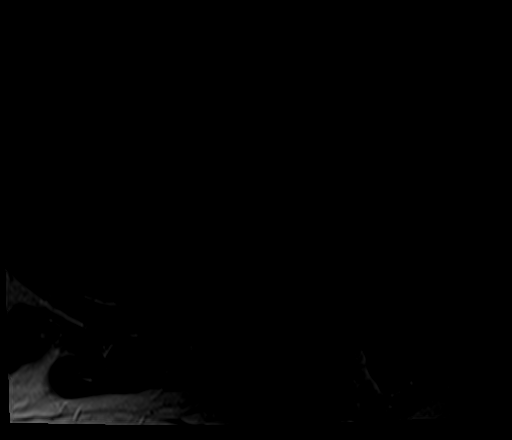
[im 44/50]
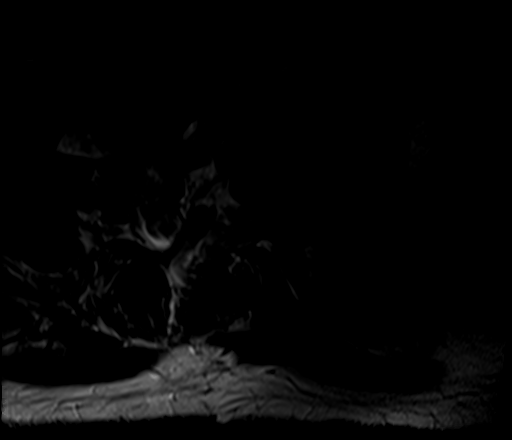

[15 of 48 positions shown; findings below may reference images not displayed]

FINDINGS: Alignment: There is severe convex left scoliosis with the apex at
approximately T9-10. No listhesis is identified.

Vertebrae: Posterior fusion hardware is in place from approximately
T6-L1. No fracture is seen. No focal lesion is identified.

Cord: Visualized cord demonstrates normal signal. A large segment of
the cord is not visible due to artifact from hardware.

Paraspinal and other soft tissues: Negative.

Disc levels:

C7-T1 negative.

T1-2: Negative.

T2-3: Negative.

T3-4: Negative.

T4-5 negative.

T5-6 to T10-11 are obscured by hardware.

T11-12: Markedly distorted by artifact from hardware but no obvious
stenosis.

T12-L1: Negative.
IMPRESSION: Limited study due to severe scoliosis and spinal fusion hardware. No
spinal stenosis or cord signal abnormality is identified.

## 2019-12-04 ENCOUNTER — Other Ambulatory Visit: Payer: Self-pay | Admitting: Rheumatology

## 2019-12-04 NOTE — Telephone Encounter (Signed)
Last Visit: 09/14/2019 Next Visit: 02/14/2020 Labs: 11/14/2019 Glucose is 113. Rest of CMP WNL. CBC WNL.   Current Dose per office note on 09/14/2019:Methotrexate 2.5 mg 8 tablets once weekly  Okay to refill per Dr. Estanislado Pandy

## 2019-12-28 MED FILL — HUMIRA PEN 40 MG/0.4ML PNKT: 40 | 28 days supply | Qty: 2 | Fill #1

## 2020-01-25 MED FILL — HUMIRA PEN 40 MG/0.4ML PNKT: 40 | 28 days supply | Qty: 2 | Fill #2

## 2020-01-31 NOTE — Progress Notes (Deleted)
Office Visit Note  Patient: Joshua English             Date of Birth: 1985/06/16           MRN: [id number]             PCP: Ferd Hibbs, NP Referring: Ferd Hibbs, NP Visit Date: 02/14/2020 Occupation: @GUAROCC @  Subjective:  No chief complaint on file.   History of Present Illness: Joshua English is a 34 y.o. male ***   Activities of Daily Living:  Patient reports morning stiffness for *** {minute/hour:19697}.   Patient {ACTIONS;DENIES/REPORTS:21021675::"Denies"} nocturnal pain.  Difficulty dressing/grooming: {ACTIONS;DENIES/REPORTS:21021675::"Denies"} Difficulty climbing stairs: {ACTIONS;DENIES/REPORTS:21021675::"Denies"} Difficulty getting out of chair: {ACTIONS;DENIES/REPORTS:21021675::"Denies"} Difficulty using hands for taps, buttons, cutlery, and/or writing: {ACTIONS;DENIES/REPORTS:21021675::"Denies"}  No Rheumatology ROS completed.   PMFS History:  Patient Active Problem List   Diagnosis Date Noted  . Psoriatic arthritis ([place]) 02/21/2019  . Other psoriasis 02/21/2019  . High risk medication use 02/21/2019    Past Medical History:  Diagnosis Date  . Psoriatic arthritis ([place])   . Scliosis    arthritis -Psoriatic  . Scoliosis     Family History  Problem Relation Age of Onset  . Heart attack Father   . Diabetes Maternal Grandmother   . Hypertension Maternal Grandmother   . Kidney disease Maternal Grandmother    Past Surgical History:  Procedure Laterality Date  . [place]   for scoliosis  . INSERTION OF MESH N/A 11/09/2016   Procedure: INSERTION OF MESH;  Surgeon: Jovita Kussmaul, MD;  Location: [place];  Service: General;  Laterality: N/A;  . KNEE SURGERY Left    menicus repair   . TONSILLECTOMY    . UMBILICAL HERNIA REPAIR N/A 11/09/2016   Procedure: HERNIA REPAIR UMBILICAL ADULT;  Surgeon: Jovita Kussmaul, MD;  Location: [place];  Service: General;  Laterality: N/A;   Social History   Social History Narrative  . Not on file    Immunization History  Administered Date(s) Administered  . Influenza-Unspecified 11/09/2017  . PFIZER SARS-COV-2 Vaccination 03/13/2019, 04/17/2019  . Tdap 10/03/2015     Objective: Vital Signs: There were no vitals taken for this visit.   Physical Exam   Musculoskeletal Exam: ***  CDAI Exam: CDAI Score: -- Patient Global: --; Provider Global: -- Swollen: --; Tender: -- Joint Exam 02/14/2020   No joint exam has been documented for this visit   There is currently no information documented on the homunculus. Go to the Rheumatology activity and complete the homunculus joint exam.  Investigation: No additional findings.  Imaging: No results found.  Recent Labs: Lab Results  Component Value Date   WBC 9.4 11/14/2019   HGB 13.7 11/14/2019   PLT 282 11/14/2019   NA 140 11/14/2019   K 3.8 11/14/2019   CL 102 11/14/2019   CO2 27 11/14/2019   GLUCOSE 113 (H) 11/14/2019   BUN 11 11/14/2019   CREATININE 0.68 11/14/2019   BILITOT 0.6 11/14/2019   AST 17 11/14/2019   ALT 27 11/14/2019   PROT 6.9 11/14/2019   CALCIUM 9.6 11/14/2019   GFRAA 144 11/14/2019   QFTBGOLDPLUS NEGATIVE 02/21/2019    Speciality Comments: No specialty comments available.  Procedures:  No procedures performed Allergies: Patient has no active allergies.   Assessment / Plan:     Visit Diagnoses: No diagnosis found.  Orders: No orders of the defined types were placed in this encounter.  No orders of the defined types were placed in this encounter.  Face-to-face time spent with patient was *** minutes. Greater than 50% of time was spent in counseling and coordination of care.  Follow-Up Instructions: No follow-ups on file.   Earnestine Mealing, CMA  Note - This record has been created using Editor, commissioning.  Chart creation errors have been sought, but may not always  have been located. Such creation errors do not reflect on  the standard of medical care.

## 2020-02-14 ENCOUNTER — Ambulatory Visit: Payer: Medicaid Other | Admitting: Physician Assistant

## 2020-02-15 NOTE — Progress Notes (Signed)
Office Visit Note  Patient: Joshua English             Date of Birth: January 28, 1986           MRN: [id number]             PCP: Lance Bosch, NP Referring: Lance Bosch, NP Visit Date: 02/28/2020 Occupation: @GUAROCC @  Subjective:  Medication monitoring   History of Present Illness: Kaige Whistler is a 35 y.o. male with history of psoriatic arthritis and osteoarthritis.  Patient is on humira 40 mg sq injections once every 14 days, Methotrexate 8 tablets by mouth once weekly, and folic acid 2 mg po daily. He has not missed any doses recently.  He is tolerating these medications without any side effects.  He denies any recent psoriatic arthritis flares.  He has chronic neck and lower back pain which he attributes to long standing history of scoliosis.  His discomfort is constant and is typically a 4/10.  He denies any joint pain.  He denies any achilles tendonitis or plantar fasciitis.  He denies any psoriasis.     Activities of Daily Living:  Patient reports morning stiffness for 30 minutes.   Patient Reports nocturnal pain.  Difficulty dressing/grooming: Denies Difficulty climbing stairs: Denies Difficulty getting out of chair: Denies Difficulty using hands for taps, buttons, cutlery, and/or writing: Denies  Review of Systems  Constitutional: Negative for fatigue.  HENT: Negative for mouth sores, mouth dryness and nose dryness.   Eyes: Negative for pain, itching and dryness.  Respiratory: Negative for shortness of breath and difficulty breathing.   Cardiovascular: Negative for chest pain and palpitations.  Gastrointestinal: Negative for blood in stool, constipation and diarrhea.  Endocrine: Negative for increased urination.  Genitourinary: Negative for difficulty urinating.  Musculoskeletal: Positive for arthralgias, joint pain, joint swelling and morning stiffness. Negative for myalgias, muscle tenderness and myalgias.  Skin: Negative for color change, rash and redness.   Allergic/Immunologic: Negative for susceptible to infections.  Neurological: Positive for headaches. Negative for dizziness, numbness, memory loss and weakness.  Hematological: Negative for bruising/bleeding tendency.  Psychiatric/Behavioral: Positive for sleep disturbance. Negative for confusion.    PMFS History:  Patient Active Problem List   Diagnosis Date Noted  . Psoriatic arthritis (HCC) 02/21/2019  . Other psoriasis 02/21/2019  . High risk medication use 02/21/2019    Past Medical History:  Diagnosis Date  . Psoriatic arthritis (HCC)   . Scliosis    arthritis -Psoriatic  . Scoliosis     Family History  Problem Relation Age of Onset  . Heart attack Father   . Diabetes Maternal Grandmother   . Hypertension Maternal Grandmother   . Kidney disease Maternal Grandmother    Past Surgical History:  Procedure Laterality Date  . BACK SURGERY  1994   for scoliosis  . INSERTION OF MESH N/A 11/09/2016   Procedure: INSERTION OF MESH;  Surgeon: 01/09/2017, MD;  Location: [facility] OR;  Service: General;  Laterality: N/A;  . KNEE SURGERY Left    menicus repair   . TONSILLECTOMY    . UMBILICAL HERNIA REPAIR N/A 11/09/2016   Procedure: HERNIA REPAIR UMBILICAL ADULT;  Surgeon: 01/09/2017, MD;  Location: [facility] OR;  Service: General;  Laterality: N/A;   Social History   Social History Narrative  . Not on file   Immunization History  Administered Date(s) Administered  . Influenza-Unspecified 11/09/2017  . PFIZER(Purple Top)SARS-COV-2 Vaccination 03/13/2019, 04/17/2019  . Tdap 10/03/2015     Objective: Vital Signs:  BP (!) 145/86 (BP Location: Right Arm, Patient Position: Sitting, Cuff Size: Normal)   Pulse 97   Resp 17   Ht 5\' 5"  (1.651 m)   Wt 176 lb 12.8 oz (80.2 kg)   BMI 29.42 kg/m    Physical Exam Vitals and nursing note reviewed.  Constitutional:      Appearance: He is well-developed and well-nourished.  HENT:     Head: Normocephalic and atraumatic.  Eyes:      Extraocular Movements: EOM normal.     Conjunctiva/sclera: Conjunctivae normal.     Pupils: Pupils are equal, round, and reactive to light.  Pulmonary:     Effort: Pulmonary effort is normal.  Abdominal:     Palpations: Abdomen is soft.  Musculoskeletal:     Cervical back: Normal range of motion and neck supple.  Skin:    General: Skin is warm and dry.     Capillary Refill: Capillary refill takes less than 2 seconds.  Neurological:     Mental Status: He is alert and oriented to person, place, and time.  Psychiatric:        Mood and Affect: Mood and affect normal.        Behavior: Behavior normal.      Musculoskeletal Exam: C-spine limited ROM with lateral rotation.  Thoracolumbar scoliosis noted.  No SI joint tenderness. Shoulder joints, elbow joints, wrist joints, MCPs, PIPs, and DIPs good ROM with no synovitis.  Complete fist formation bilaterally.  Knee joints good ROM with no discomfort.  No warmth or effusion of knee joints.  No tenderness or swelling of ankle joints.  No achilles tendonitis or plantar fasciitis.   CDAI Exam: CDAI Score: -- Patient Global: --; Provider Global: -- Swollen: --; Tender: -- Joint Exam 02/28/2020   No joint exam has been documented for this visit   There is currently no information documented on the homunculus. Go to the Rheumatology activity and complete the homunculus joint exam.  Investigation: No additional findings.  Imaging: No results found.  Recent Labs: Lab Results  Component Value Date   WBC 9.4 11/14/2019   HGB 13.7 11/14/2019   PLT 282 11/14/2019   NA 140 11/14/2019   K 3.8 11/14/2019   CL 102 11/14/2019   CO2 27 11/14/2019   GLUCOSE 113 (H) 11/14/2019   BUN 11 11/14/2019   CREATININE 0.68 11/14/2019   BILITOT 0.6 11/14/2019   AST 17 11/14/2019   ALT 27 11/14/2019   PROT 6.9 11/14/2019   CALCIUM 9.6 11/14/2019   GFRAA 144 11/14/2019   QFTBGOLDPLUS NEGATIVE 02/21/2019    Speciality Comments: No specialty comments  available.  Procedures:  No procedures performed Allergies: Patient has no active allergies.   Assessment / Plan:     Visit Diagnoses: Psoriatic arthritis ([place]) - Diagnosed while he was in college.  Previous patient of Dr. Dossie Der.  (Enbrel get adequate response, Remicade discontinued due to insurance issues): He has no synovitis or dactylitis on exam.  He has not had any recent psoriatic arthritis flares.  He is clinically doing well on Humira 40 mg sq injections once every 14 days, methotrexate 8 tablets by mouth once weekly, and folic acid 2 mg po daily. He is tolerating these medications without any side effects.  He has not had any achilles tendonitis, plantar fasciitis, or SI joint discomfort recently. He has no increased joint pain or joint swelling at this time. he has no active psoriasis at this time. He was advised to notify us if  he develops increased joint pain or joint swelling.  He will follow up in 5 months.   Psoriasis: He has no active psoriasis at this time.   High risk medication use - Humira 40 mg injection every 14 days, Methotrexate 2.5 mg 8 tablets once weekly, and folic acid 1 mg 2 tablets by mouth daily.TB gold negative on 02/21/19.  He is due to update TB gold. Order released.  CBC and CMP updated on 11/14/19.  Orders for CBC and CMP were released.  His next lab work will be due in April and every 3 months to monitor for drug toxicity.  Standing orders for CBC and CMP remain in place.  He has not had any recent infections.  He was advised to hold humira and methotrexate if he develops signs or symptoms of an infection and to resume once the infection has completely cleared.   He has received both covid-19 vaccinations and was encouraged to receive the booster dose.  He was advised to hold MTX one week after the booster.  He was advised to follow up with PCP or dermatologist for yearly skin exam while on Humira.    Screening for tuberculosis - Order for TB released today. Plan:  QuantiFERON-TB Gold Plus, CBC with Differential/Platelet, COMPLETE METABOLIC PANEL WITH GFR  Primary osteoarthritis of both hands: He has no joint tenderness or inflammation on exam.  He is able to make a complete fist bilaterally.    Primary osteoarthritis of both feet: He is not having any discomfort in his feet at this time.  He has good ROM of both ankle joints with no tenderness or inflammation.  He is wearing proper fitting shoes.   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 He has no SI joint tenderness or discomfort at this time.   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  Essential hypertension  Orders: Orders Placed This Encounter  Procedures  . QuantiFERON-TB Gold Plus  . CBC with Differential/Platelet  . COMPLETE METABOLIC PANEL WITH GFR   Meds ordered this encounter  Medications  . methotrexate 2.5 MG tablet    Sig: Take 8 tablets (20 mg total) by mouth once a week. Caution:Chemotherapy. Protect from light.    Dispense:  96 tablet    Refill:  0     Follow-Up Instructions: Return in about 5 months (around 07/28/2020) for Psoriatic arthritis, Osteoarthritis.   Ofilia Neas, PA-C  Note - This record has been created using Dragon software.  Chart creation errors have been sought, but may not always  have been located. Such creation errors do not reflect on  the standard of medical care.

## 2020-02-21 ENCOUNTER — Other Ambulatory Visit: Payer: Self-pay | Admitting: Rheumatology

## 2020-02-21 ENCOUNTER — Other Ambulatory Visit: Payer: Self-pay | Admitting: Physician Assistant

## 2020-02-21 DIAGNOSIS — L4050 Arthropathic psoriasis, unspecified: Secondary | ICD-10-CM

## 2020-02-21 MED FILL — HUMIRA PEN 40 MG/0.4ML PNKT: 40 | 28 days supply | Qty: 2 | Fill #0

## 2020-02-21 NOTE — Telephone Encounter (Signed)
Please advise the patient to update CBC, CMP, and TB gold this week. Please place future orders.

## 2020-02-21 NOTE — Telephone Encounter (Signed)
Last Visit: 09/14/2019 Next Visit: 02/28/2020 Labs: 11/13/2020, Glucose is 113. Rest of CMP WNL. CBC WNL.  TB Gold: 02/21/2019, negative  Current Dose per office note 09/14/2019, Humira 40 mg injection every 14 days  NO:TRRNHAFBX arthritis   Okay to refill Humira?

## 2020-02-28 ENCOUNTER — Ambulatory Visit: Payer: Medicaid Other | Admitting: Physician Assistant

## 2020-02-28 ENCOUNTER — Encounter: Payer: Self-pay | Admitting: Physician Assistant

## 2020-02-28 ENCOUNTER — Other Ambulatory Visit: Payer: Self-pay

## 2020-02-28 VITALS — BP 145/86 | HR 97 | Resp 17 | Ht 65.0 in | Wt 176.8 lb

## 2020-02-28 DIAGNOSIS — L409 Psoriasis, unspecified: Secondary | ICD-10-CM | POA: Diagnosis not present

## 2020-02-28 DIAGNOSIS — Z981 Arthrodesis status: Secondary | ICD-10-CM

## 2020-02-28 DIAGNOSIS — Z79899 Other long term (current) drug therapy: Secondary | ICD-10-CM | POA: Diagnosis not present

## 2020-02-28 DIAGNOSIS — M19072 Primary osteoarthritis, left ankle and foot: Secondary | ICD-10-CM

## 2020-02-28 DIAGNOSIS — I10 Essential (primary) hypertension: Secondary | ICD-10-CM

## 2020-02-28 DIAGNOSIS — L4050 Arthropathic psoriasis, unspecified: Secondary | ICD-10-CM

## 2020-02-28 DIAGNOSIS — M19041 Primary osteoarthritis, right hand: Secondary | ICD-10-CM | POA: Diagnosis not present

## 2020-02-28 DIAGNOSIS — G8929 Other chronic pain: Secondary | ICD-10-CM

## 2020-02-28 DIAGNOSIS — M19042 Primary osteoarthritis, left hand: Secondary | ICD-10-CM

## 2020-02-28 DIAGNOSIS — Z111 Encounter for screening for respiratory tuberculosis: Secondary | ICD-10-CM

## 2020-02-28 DIAGNOSIS — M19071 Primary osteoarthritis, right ankle and foot: Secondary | ICD-10-CM

## 2020-02-28 DIAGNOSIS — M533 Sacrococcygeal disorders, not elsewhere classified: Secondary | ICD-10-CM

## 2020-02-28 MED ORDER — METHOTREXATE SODIUM 2.5 MG PO TABS: 20.0000 mg | ORAL_TABLET | ORAL | 0 refills | Status: DC

## 2020-02-28 NOTE — Patient Instructions (Signed)
Standing Labs We placed an order today for your standing lab work.   Please have your standing labs drawn in April and every 3 months   If possible, please have your labs drawn 2 weeks prior to your appointment so that the provider can discuss your results at your appointment.  We have open lab daily Monday through Thursday from 8:30-12:30 PM and 1:30-4:30 PM and Friday from 8:30-12:30 PM and 1:30-4:00 PM at the office of Dr. Shaili Deveshwar, [facility].   Please be advised, patients with office appointments requiring lab work will take precedents over walk-in lab work.  If possible, please come for your lab work on Monday and Friday afternoons, as you may experience shorter wait times. The office is located at [address], [address], [place], [place] [address] No appointment is necessary.   Labs are drawn by [facility]. Please bring your co-pay at the time of your lab draw.  You may receive a bill from [facility] for your lab work.  If you wish to have your labs drawn at another location, please call the office 24 hours in advance to send orders.  If you have any questions regarding directions or hours of operation,  please call [phone].   As a reminder, please drink plenty of water prior to coming for your lab work. Thanks!   

## 2020-02-29 NOTE — Progress Notes (Signed)
Absolute monocytes borderline elevated. Rest of CBC WNL.  CMP WNL.

## 2020-03-01 LAB — CBC WITH DIFFERENTIAL/PLATELET
Absolute Monocytes: 1071 cells/uL — ABNORMAL HIGH (ref 200–950)
Basophils Absolute: 51 cells/uL (ref 0–200)
Basophils Relative: 0.5 %
Eosinophils Absolute: 91 cells/uL (ref 15–500)
Eosinophils Relative: 0.9 %
HCT: 41.2 % (ref 38.5–50.0)
Hemoglobin: 13.9 g/dL (ref 13.2–17.1)
Lymphs Abs: 3323 cells/uL (ref 850–3900)
MCH: 31.3 pg (ref 27.0–33.0)
MCHC: 33.7 g/dL (ref 32.0–36.0)
MCV: 92.8 fL (ref 80.0–100.0)
MPV: 10 fL (ref 7.5–12.5)
Monocytes Relative: 10.6 %
Neutro Abs: 5565 cells/uL (ref 1500–7800)
Neutrophils Relative %: 55.1 %
Platelets: 365 10*3/uL (ref 140–400)
RBC: 4.44 10*6/uL (ref 4.20–5.80)
RDW: 12.6 % (ref 11.0–15.0)
Total Lymphocyte: 32.9 %
WBC: 10.1 10*3/uL (ref 3.8–10.8)

## 2020-03-01 LAB — COMPLETE METABOLIC PANEL WITH GFR
AG Ratio: 1.4 (calc) (ref 1.0–2.5)
ALT: 43 U/L (ref 9–46)
AST: 23 U/L (ref 10–40)
Albumin: 4.5 g/dL (ref 3.6–5.1)
Alkaline phosphatase (APISO): 80 U/L (ref 36–130)
BUN: 13 mg/dL (ref 7–25)
CO2: 30 mmol/L (ref 20–32)
Calcium: 9.6 mg/dL (ref 8.6–10.3)
Chloride: 100 mmol/L (ref 98–110)
Creat: 0.66 mg/dL (ref 0.60–1.35)
GFR, Est African American: 146 mL/min/{1.73_m2} (ref >=60)
GFR, Est Non African American: 126 mL/min/{1.73_m2} (ref >=60)
Globulin: 3.3 g/dL (calc) (ref 1.9–3.7)
Glucose, Bld: 85 mg/dL (ref 65–99)
Potassium: 4.1 mmol/L (ref 3.5–5.3)
Sodium: 137 mmol/L (ref 135–146)
Total Bilirubin: 0.7 mg/dL (ref 0.2–1.2)
Total Protein: 7.8 g/dL (ref 6.1–8.1)

## 2020-03-01 LAB — QUANTIFERON-TB GOLD PLUS
Mitogen-NIL: >10 IU/mL
NIL: 0.04 IU/mL
QuantiFERON-TB Gold Plus: NEGATIVE
TB1-NIL: <0 IU/mL
TB2-NIL: 0 IU/mL

## 2020-03-01 NOTE — Progress Notes (Signed)
TB gold negative

## 2020-03-20 ENCOUNTER — Other Ambulatory Visit: Payer: Self-pay | Admitting: Physician Assistant

## 2020-03-20 DIAGNOSIS — L4050 Arthropathic psoriasis, unspecified: Secondary | ICD-10-CM

## 2020-03-20 NOTE — Telephone Encounter (Signed)
Last Visit: 02/28/2020 Next Visit: Message sent to front desk to schedule appt,  Labs: 02/28/2020, Absolute monocytes borderline elevated. Rest of CBC WNL. CMP WNL.  TB Gold: 02/28/2020, negative  Current Dose per office note 02/28/2020, Humira 40 mg injection every 14 days,   DX: Psoriatic arthritis   Last Fill: 02/21/2020  Okay to refill Humira?

## 2020-03-21 MED FILL — HUMIRA PEN 40 MG/0.4ML PNKT: 40 | 28 days supply | Qty: 2 | Fill #0

## 2020-04-17 MED FILL — HUMIRA PEN 40 MG/0.4ML PNKT: 40 | 28 days supply | Qty: 2 | Fill #1

## 2020-05-09 MED FILL — HUMIRA PEN 40 MG/0.4ML PNKT: 40 | 28 days supply | Qty: 2 | Fill #2

## 2020-05-10 ENCOUNTER — Other Ambulatory Visit (HOSPITAL_COMMUNITY): Payer: Self-pay

## 2020-05-27 ENCOUNTER — Other Ambulatory Visit: Payer: Self-pay | Admitting: Physician Assistant

## 2020-05-27 NOTE — Telephone Encounter (Signed)
Next Visit: 07/25/2020  Last Visit:02/28/2020  Last Fill: 02/28/2020  DX: Psoriatic arthritis   Current Dose per office note 02/28/2020, Methotrexate 2.5 mg 8 tablets once weekly  Labs: 02/28/2020, Methotrexate 2.5 mg 8 tablets once weekly, I called  labs are due  Okay to refill MTX?

## 2020-05-29 ENCOUNTER — Other Ambulatory Visit: Payer: Self-pay | Admitting: *Deleted

## 2020-05-29 DIAGNOSIS — Z79899 Other long term (current) drug therapy: Secondary | ICD-10-CM

## 2020-05-30 LAB — COMPLETE METABOLIC PANEL WITH GFR
AG Ratio: 1.5 (calc) (ref 1.0–2.5)
ALT: 28 U/L (ref 9–46)
AST: 17 U/L (ref 10–40)
Albumin: 4.4 g/dL (ref 3.6–5.1)
Alkaline phosphatase (APISO): 81 U/L (ref 36–130)
BUN: 12 mg/dL (ref 7–25)
CO2: 30 mmol/L (ref 20–32)
Calcium: 9.8 mg/dL (ref 8.6–10.3)
Chloride: 100 mmol/L (ref 98–110)
Creat: 0.7 mg/dL (ref 0.60–1.35)
GFR, Est African American: 143 mL/min/{1.73_m2} (ref >=60)
GFR, Est Non African American: 123 mL/min/{1.73_m2} (ref >=60)
Globulin: 3 g/dL (calc) (ref 1.9–3.7)
Glucose, Bld: 95 mg/dL (ref 65–99)
Potassium: 4.5 mmol/L (ref 3.5–5.3)
Sodium: 136 mmol/L (ref 135–146)
Total Bilirubin: 0.8 mg/dL (ref 0.2–1.2)
Total Protein: 7.4 g/dL (ref 6.1–8.1)

## 2020-05-30 LAB — CBC WITH DIFFERENTIAL/PLATELET
Absolute Monocytes: 689 cells/uL (ref 200–950)
Basophils Absolute: 57 cells/uL (ref 0–200)
Basophils Relative: 0.7 %
Eosinophils Absolute: 154 cells/uL (ref 15–500)
Eosinophils Relative: 1.9 %
HCT: 41.9 % (ref 38.5–50.0)
Hemoglobin: 14.2 g/dL (ref 13.2–17.1)
Lymphs Abs: 3515 cells/uL (ref 850–3900)
MCH: 32 pg (ref 27.0–33.0)
MCHC: 33.9 g/dL (ref 32.0–36.0)
MCV: 94.4 fL (ref 80.0–100.0)
MPV: 9.9 fL (ref 7.5–12.5)
Monocytes Relative: 8.5 %
Neutro Abs: 3686 cells/uL (ref 1500–7800)
Neutrophils Relative %: 45.5 %
Platelets: 278 10*3/uL (ref 140–400)
RBC: 4.44 10*6/uL (ref 4.20–5.80)
RDW: 13 % (ref 11.0–15.0)
Total Lymphocyte: 43.4 %
WBC: 8.1 10*3/uL (ref 3.8–10.8)

## 2020-06-10 ENCOUNTER — Other Ambulatory Visit (HOSPITAL_COMMUNITY): Payer: Self-pay

## 2020-06-10 ENCOUNTER — Other Ambulatory Visit: Payer: Self-pay | Admitting: Physician Assistant

## 2020-06-10 DIAGNOSIS — L4050 Arthropathic psoriasis, unspecified: Secondary | ICD-10-CM

## 2020-06-10 NOTE — Telephone Encounter (Signed)
Next Visit: 07/25/2020  Last Visit: 02/28/2020  Last Fill: 03/20/2020  DX:  Psoriatic arthritis   Current Dose per office note 02/28/2020, Humira 40 mg injection every 14 days  Labs: 05/29/2020, CBC and CMP WNL  TB Gold: 02/28/2020, TB gold negative   Okay to refill Humira?

## 2020-06-11 ENCOUNTER — Other Ambulatory Visit (HOSPITAL_COMMUNITY): Payer: Self-pay

## 2020-06-11 MED ORDER — HUMIRA (2 PEN) 40 MG/0.4ML ~~LOC~~ AJKT
AUTO-INJECTOR | SUBCUTANEOUS | 0 refills | Status: DC
  Filled 2020-06-11: qty 2, 28d supply, fill #0
  Filled 2020-07-05: qty 2, 28d supply, fill #1
  Filled 2020-07-31: qty 2, 28d supply, fill #2

## 2020-06-25 ENCOUNTER — Other Ambulatory Visit: Payer: Self-pay

## 2020-06-25 MED ORDER — CYCLOBENZAPRINE HCL 10 MG PO TABS: 10.0000 mg | ORAL_TABLET | Freq: Every day | ORAL | 0 refills | Status: DC

## 2020-06-25 NOTE — Telephone Encounter (Signed)
I called patient, patient will contact PCP for Atenolol for refill.

## 2020-06-25 NOTE — Telephone Encounter (Signed)
Next Visit: 07/25/2020  Last Visit: 02/28/2020  Last Fill: 10/26/2018 Atenolol, Flexeril 10/05/2015  Dx: Psoriatic arthritis   Current Dose per office note on 02/28/2020, not mentioned  Okay to refill Flexeril and Atenolol?

## 2020-06-25 NOTE — Telephone Encounter (Signed)
Patient called requesting prescription refills of Cyclobenzaprine and Tenormin to be sent to [facility] at [address].

## 2020-06-25 NOTE — Telephone Encounter (Signed)
I do not prescribe Tylenol.  Please advise patient to reach his PCP.

## 2020-07-05 ENCOUNTER — Other Ambulatory Visit (HOSPITAL_COMMUNITY): Payer: Self-pay

## 2020-07-09 ENCOUNTER — Other Ambulatory Visit (HOSPITAL_COMMUNITY): Payer: Self-pay

## 2020-07-11 NOTE — Progress Notes (Signed)
Office Visit Note  Patient: Joshua English             Date of Birth: [birth year]           MRN: [id number]             PCP: Ferd Hibbs, NP Referring: Ferd Hibbs, NP Visit Date: 07/25/2020 Occupation: @GUAROCC @  Subjective:  Medication management.   History of Present Illness: Thad Ohlin is a 35 y.o. male with a history of psoriatic arthritis and psoriasis.  He has been taking Humira and methotrexate on a regular basis.  He denies any joint swelling or joint pain.  He continues to have some morning stiffness.  He denies any psoriasis rash.  He continues to have some lower back pain especially after prolonged sitting.  He has been taking Flexeril 10 mg p.o. nightly for lower back pain.  He currently does not have any SI joint pain.  He states that the SI joint pain comes and goes.  Activities of Daily Living:  Patient reports morning stiffness for 30 minutes.   Patient Denies nocturnal pain.  Difficulty dressing/grooming: Denies Difficulty climbing stairs: Denies Difficulty getting out of chair: Denies Difficulty using hands for taps, buttons, cutlery, and/or writing: Denies  Review of Systems  Constitutional:  Negative for fatigue and night sweats.  HENT:  Negative for mouth sores, mouth dryness and nose dryness.   Eyes:  Negative for redness and dryness.  Respiratory:  Negative for shortness of breath and difficulty breathing.   Cardiovascular:  Negative for chest pain, palpitations, hypertension, irregular heartbeat and swelling in legs/feet.  Gastrointestinal:  Negative for constipation and diarrhea.  Endocrine: Negative for increased urination.  Musculoskeletal:  Positive for joint pain, joint pain and morning stiffness. Negative for joint swelling, myalgias, muscle weakness, muscle tenderness and myalgias.  Skin:  Negative for color change, rash, hair loss, nodules/bumps, skin tightness, ulcers and sensitivity to sunlight.  Allergic/Immunologic: Negative for  susceptible to infections.  Neurological:  Negative for dizziness, fainting, memory loss, night sweats and weakness ( ).  Hematological:  Negative for swollen glands.  Psychiatric/Behavioral:  Negative for depressed mood and sleep disturbance. The patient is not nervous/anxious.    PMFS History:  Patient Active Problem List   Diagnosis Date Noted   Psoriatic arthritis ([place]) 02/21/2019   Other psoriasis 02/21/2019   High risk medication use 02/21/2019    Past Medical History:  Diagnosis Date   Psoriatic arthritis ([place])    Scliosis    arthritis -Psoriatic   Scoliosis     Family History  Problem Relation Age of Onset   Heart attack Father    Diabetes Maternal Grandmother    Hypertension Maternal Grandmother    Kidney disease Maternal Grandmother    Past Surgical History:  Procedure Laterality Date   BACK SURGERY  1994   for scoliosis   INSERTION OF MESH N/A 11/09/2016   Procedure: INSERTION OF MESH;  Surgeon: Jovita Kussmaul, MD;  Location: [facility];  Service: General;  Laterality: N/A;   KNEE SURGERY Left    menicus repair    TONSILLECTOMY     UMBILICAL HERNIA REPAIR N/A 11/09/2016   Procedure: HERNIA REPAIR UMBILICAL ADULT;  Surgeon: Jovita Kussmaul, MD;  Location: [facility] OR;  Service: General;  Laterality: N/A;   Social History   Social History Narrative   Not on file   Immunization History  Administered Date(s) Administered   Influenza-Unspecified 11/09/2017   PFIZER(Purple Top)SARS-COV-2 Vaccination 03/13/2019, 04/17/2019   Tdap  10/03/2015     Objective: Vital Signs: BP 119/83 (BP Location: Left Arm, Patient Position: Sitting, Cuff Size: Normal)   Pulse 80   Resp 15   Ht 5\' 6"  (1.676 m)   Wt 182 lb 12.8 oz (82.9 kg)   BMI 29.50 kg/m    Physical Exam Vitals and nursing note reviewed.  Constitutional:      Appearance: He is well-developed.  HENT:     Head: Normocephalic and atraumatic.  Eyes:     Conjunctiva/sclera: Conjunctivae normal.     Pupils: Pupils are  equal, round, and reactive to light.  Cardiovascular:     Rate and Rhythm: Normal rate and regular rhythm.     Heart sounds: Normal heart sounds.  Pulmonary:     Effort: Pulmonary effort is normal.     Breath sounds: Normal breath sounds.  Abdominal:     General: Bowel sounds are normal.     Palpations: Abdomen is soft.  Musculoskeletal:     Cervical back: Normal range of motion and neck supple.  Skin:    General: Skin is warm and dry.     Capillary Refill: Capillary refill takes less than 2 seconds.  Neurological:     Mental Status: He is alert and oriented to person, place, and time.  Psychiatric:        Behavior: Behavior normal.     Musculoskeletal Exam: C-spine was in good range of motion.  He had severe thoracolumbar scoliosis and kyphosis.  Shoulder joints, elbow joints, wrist joints, MCPs PIPs and DIPs with good range of motion with no synovitis.  Hip joints, knee joints, ankles, MTPs and PIPs with good range of motion with no synovitis.  There was no Achilles tendinitis or plantar fasciitis .  CDAI Exam: CDAI Score: -- Patient Global: --; Provider Global: -- Swollen: --; Tender: -- Joint Exam 07/25/2020   No joint exam has been documented for this visit   There is currently no information documented on the homunculus. Go to the Rheumatology activity and complete the homunculus joint exam.  Investigation: No additional findings.  Imaging: No results found.  Recent Labs: Lab Results  Component Value Date   WBC 8.1 05/29/2020   HGB 14.2 05/29/2020   PLT 278 05/29/2020   NA 136 05/29/2020   K 4.5 05/29/2020   CL 100 05/29/2020   CO2 30 05/29/2020   GLUCOSE 95 05/29/2020   BUN 12 05/29/2020   CREATININE 0.70 05/29/2020   BILITOT 0.8 05/29/2020   AST 17 05/29/2020   ALT 28 05/29/2020   PROT 7.4 05/29/2020   CALCIUM 9.8 05/29/2020   GFRAA 143 05/29/2020   QFTBGOLDPLUS NEGATIVE 02/28/2020    Speciality Comments: No specialty comments  available.  Procedures:  No procedures performed Allergies: Patient has no active allergies.   Assessment / Plan:     Visit Diagnoses: Psoriatic arthritis ([place]) - Diagnosed while he was in college.  Previous patient of Dr. Dossie Der.  (Enbrel get adequate response, Remicade discontinued due to insurance issues): He is clinically doing well on the current combination.  He had no synovitis on my examination.  He complains of some stiffness.  Psoriasis-he had no active psoriasis lesions.  High risk medication use - Humira 40 mg injection every 14 days, Methotrexate 2.5 mg 8 tablets once weekly, and folic acid 1 mg 2 tablets by mouth daily. - Plan: CBC with Differential/Platelet, COMPLETE METABOLIC PANEL WITH GFR, today and then every 3 months to monitor for drug toxicity.  He  has been advised to stop Humira and methotrexate in case he develops an infection and then resume the medications after infection resolves.  He was advised to get annual skin examination to screen for nonmelanoma skin cancer.  Ambulatory referral to Dermatology.  Updated information on the immunization was given and information was placed in the AVS.  At this point he does not want to receive any further COVID-19 vaccines.  Primary osteoarthritis of both hands-he had no synovitis on examination.  Primary osteoarthritis of both feet-there was no tenderness over ankles or MTPs.  There was no evidence of [place] tendinitis or plantar fasciitis.  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  He has intermittent SI joint discomfort.  He had no tenderness today.  History of thoracic spinal fusion - Congenital scoliosis, surgery at age of 73.  He has chronic pain and stiffness.  He has been taking Flexeril at bedtime to relieve stiffness.  Side effects of Flexeril were discussed.  Essential hypertension-his blood pressure was normal today.  Increased risk of heart disease with psoriatic arthritis was  discussed.  Need for regular exercise and dietary modifications were discussed.  Information was placed in the AVS.  Orders: Orders Placed This Encounter  Procedures   CBC with Differential/Platelet   COMPLETE METABOLIC PANEL WITH GFR   Ambulatory referral to Dermatology    No orders of the defined types were placed in this encounter.    Follow-Up Instructions: Return in about 5 months (around 12/25/2020) for Psoriatic arthritis.   Bo Merino, MD  Note - This record has been created using Editor, commissioning.  Chart creation errors have been sought, but may not always  have been located. Such creation errors do not reflect on  the standard of medical care.

## 2020-07-17 ENCOUNTER — Other Ambulatory Visit (HOSPITAL_COMMUNITY): Payer: Self-pay

## 2020-07-18 ENCOUNTER — Telehealth: Payer: Self-pay

## 2020-07-18 ENCOUNTER — Telehealth: Payer: Self-pay | Admitting: Rheumatology

## 2020-07-18 MED ORDER — CYCLOBENZAPRINE HCL 10 MG PO TABS: 10.0000 mg | ORAL_TABLET | Freq: Every day | ORAL | 2 refills | Status: DC

## 2020-07-18 NOTE — Telephone Encounter (Addendum)
Spoke with patient and advised after chart review, Jun 25, 2020 is the first time we had prescribed the Cyclobenzaprine. Asked patient if he may have been getting the prescription from another doctor for twice a day. Patient states he may have been getting it from his PCP. Patient advised Dr. Estanislado Pandy only prescribes the Cyclobenzaprine once at bedtime. Patient states he is in need of a refill. Patient advised it is to early for a refill as we sent in 30 tablets on 06/25/2020. Patient states he only received 21 tablets. Patient advised he would need to contact the pharmacy to inquire why he only received 21 tablets. Patient advised we would be unable to refill prescription until next week. Patient was upset and states this is ridiculous. Patient advised he may contact his PCP and have them take over the prescription.  Contacted the pharmacy and spoke with pharmacist. She confirmed he only received 21 tablets as that is all his insurance would allow. Patient still has 9 remaining pills to be dispensed. Patient advised we will be able to refill the prescription next week when due.   Patient called the office back stating he spoke with the pharmacy and is aware that his insurance is only covering 21 tablets. Patient advised I spoke with the pharmacy as well and they have 9 tablets left to dispense and he will be due for his next refill next week. Patient states "I am not hoarding pills." I advised patient we did not say he was. We informed him that the prescription was sent to the pharmacy for 30 tablets and he should speak with the pharmacy as to why he only received the 21 tablets. Patient advised with his next prescription we would send it in for the amount the insurance would cover so there would be no issues with the following refills.

## 2020-07-18 NOTE — Telephone Encounter (Signed)
Patient called stating he spoke with the pharmacist at [facility] and he was only given 21 not 30 tablets.  Call was transferred to [facility].

## 2020-07-18 NOTE — Telephone Encounter (Signed)
See previous phone note.  

## 2020-07-18 NOTE — Telephone Encounter (Signed)
Patient calling in reference to rx for Cyclobenzaprine. Per patient directions have changed from how he was taking it, and he was wondering why. He was not informed directions were to change. Patient takes two a day, and directions now say 1 tab at bedtime.Patient is out, and needs refill. Patient uses [facility] on [address].

## 2020-07-25 ENCOUNTER — Ambulatory Visit: Payer: Medicaid Other | Admitting: Rheumatology

## 2020-07-25 ENCOUNTER — Other Ambulatory Visit: Payer: Self-pay

## 2020-07-25 ENCOUNTER — Encounter: Payer: Self-pay | Admitting: Rheumatology

## 2020-07-25 VITALS — BP 119/83 | HR 80 | Resp 15 | Ht 66.0 in | Wt 182.8 lb

## 2020-07-25 DIAGNOSIS — L409 Psoriasis, unspecified: Secondary | ICD-10-CM

## 2020-07-25 DIAGNOSIS — M19041 Primary osteoarthritis, right hand: Secondary | ICD-10-CM

## 2020-07-25 DIAGNOSIS — M533 Sacrococcygeal disorders, not elsewhere classified: Secondary | ICD-10-CM

## 2020-07-25 DIAGNOSIS — M19072 Primary osteoarthritis, left ankle and foot: Secondary | ICD-10-CM

## 2020-07-25 DIAGNOSIS — Z79899 Other long term (current) drug therapy: Secondary | ICD-10-CM

## 2020-07-25 DIAGNOSIS — I10 Essential (primary) hypertension: Secondary | ICD-10-CM

## 2020-07-25 DIAGNOSIS — L4050 Arthropathic psoriasis, unspecified: Secondary | ICD-10-CM | POA: Diagnosis not present

## 2020-07-25 DIAGNOSIS — M19042 Primary osteoarthritis, left hand: Secondary | ICD-10-CM

## 2020-07-25 DIAGNOSIS — M19071 Primary osteoarthritis, right ankle and foot: Secondary | ICD-10-CM

## 2020-07-25 DIAGNOSIS — Z981 Arthrodesis status: Secondary | ICD-10-CM

## 2020-07-25 DIAGNOSIS — G8929 Other chronic pain: Secondary | ICD-10-CM

## 2020-07-25 NOTE — Patient Instructions (Signed)
Standing Labs We placed an order today for your standing lab work.   Please have your standing labs drawn in September and every 3 months  If possible, please have your labs drawn 2 weeks prior to your appointment so that the provider can discuss your results at your appointment.  Please note that you may see your imaging and lab results in [facility] before we have reviewed them. We may be awaiting multiple results to interpret others before contacting you. Please allow our office up to 72 hours to thoroughly review all of the results before contacting the office for clarification of your results.  We have open lab daily: Monday through Thursday from 1:30-4:30 PM and Friday from 1:30-4:00 PM at the office of Dr. Bo Merino, [facility].   Please be advised, all patients with office appointments requiring lab work will take precedent over walk-in lab work.  If possible, please come for your lab work on Monday and Friday afternoons, as you may experience shorter wait times. The office is located at [address], [place], [place], [place] [address] No appointment is necessary.   Labs are drawn by [facility]. Please bring your co-pay at the time of your lab draw.  You may receive a bill from [facility] for your lab work.  If you wish to have your labs drawn at another location, please call the office 24 hours in advance to send orders.  If you have any questions regarding directions or hours of operation,  please call [phone].   As a reminder, please drink plenty of water prior to coming for your lab work. Thanks!   Vaccines You are taking a medication(s) that can suppress your immune system.  The following immunizations are recommended: Flu annually Covid-19  Td/Tdap (tetanus, diphtheria, pertussis) every 10 years Pneumonia (Prevnar 15 then Pneumovax 23 at least 1 year apart.  Alternatively, can take Prevnar 20 without needing additional dose) Shingrix (after age 2): 2  doses from 4 weeks to 6 months apart  Please check with your PCP to make sure you are up to date.   If you test POSITIVE for COVID19 and have MILD to MODERATE symptoms: First, call your PCP if you would like to receive COVID19 treatment AND Hold your medications during the infection and for at least 1 week after your symptoms have resolved: Injectable medication (Benlysta, Cimzia, Cosentyx, Enbrel, Humira, Orencia, Remicade, Simponi, Stelara, Taltz, Tremfya) Methotrexate Leflunomide (Arava) Mycophenolate (Cellcept) Morrie Sheldon, Olumiant, or Rinvoq If you take Actemra or Kevzara, you DO NOT need to hold these for COVID19 infection.  If you test POSITIVE for COVID19 and have NO symptoms: First, call your PCP if you would like to receive COVID19 treatment AND Hold your medications for at least 10 days after the day that you tested positive Injectable medication (Benlysta, Cimzia, Cosentyx, Enbrel, Humira, Orencia, Remicade, Simponi, Stelara, Taltz, Tremfya) Methotrexate Leflunomide (Arava) Mycophenolate (Cellcept) Morrie Sheldon, Olumiant, or Rinvoq If you take Actemra or Kevzara, you DO NOT need to hold these for COVID19 infection.  If you have signs or symptoms of an infection or start antibiotics: First, call your PCP for workup of your infection. Hold your medication through the infection, until you complete your antibiotics, and until symptoms resolve if you take the following: Injectable medication (Actemra, Benlysta, Cimzia, Cosentyx, Enbrel, Humira, Kevzara, Orencia, Remicade, Simponi, Stelara, Taltz, Tremfya) Methotrexate Leflunomide (Arava) Mycophenolate (Cellcept) Roma Kayser, or Rinvoq   Please get annual skin examination by the dermatologist to screen for nonmelanoma skin cancer while  you are on Humira.   Heart Disease Prevention   Your inflammatory disease increases your risk of heart disease which includes heart attack, stroke, atrial fibrillation (irregular heartbeats),  high blood pressure, heart failure and atherosclerosis (plaque in the arteries).  It is important to reduce your risk by:   Keep blood pressure, cholesterol, and blood sugar at healthy levels   Smoking Cessation   Maintain a healthy weight  BMI 20-25   Eat a healthy diet  Plenty of fresh fruit, vegetables, and whole grains  Limit saturated fats, foods high in sodium, and added sugars  DASH and Mediterranean diet   Increase physical activity  Recommend moderate physically activity for 150 minutes per week/ 30 minutes a day for five days a week These can be broken up into three separate ten-minute sessions during the day.   Reduce Stress  Meditation, slow breathing exercises, yoga, coloring books  Dental visits twice a year

## 2020-07-26 LAB — CBC WITH DIFFERENTIAL/PLATELET
Absolute Monocytes: 1099 cells/uL — ABNORMAL HIGH (ref 200–950)
Basophils Absolute: 79 cells/uL (ref 0–200)
Basophils Relative: 0.8 %
Eosinophils Absolute: 218 cells/uL (ref 15–500)
Eosinophils Relative: 2.2 %
HCT: 39.9 % (ref 38.5–50.0)
Hemoglobin: 13.5 g/dL (ref 13.2–17.1)
Lymphs Abs: 4227 cells/uL — ABNORMAL HIGH (ref 850–3900)
MCH: 32.2 pg (ref 27.0–33.0)
MCHC: 33.8 g/dL (ref 32.0–36.0)
MCV: 95.2 fL (ref 80.0–100.0)
MPV: 9.6 fL (ref 7.5–12.5)
Monocytes Relative: 11.1 %
Neutro Abs: 4277 cells/uL (ref 1500–7800)
Neutrophils Relative %: 43.2 %
Platelets: 318 10*3/uL (ref 140–400)
RBC: 4.19 10*6/uL — ABNORMAL LOW (ref 4.20–5.80)
RDW: 12.9 % (ref 11.0–15.0)
Total Lymphocyte: 42.7 %
WBC: 9.9 10*3/uL (ref 3.8–10.8)

## 2020-07-26 LAB — COMPLETE METABOLIC PANEL WITH GFR
AG Ratio: 1.7 (calc) (ref 1.0–2.5)
ALT: 17 U/L (ref 9–46)
AST: 15 U/L (ref 10–40)
Albumin: 4.5 g/dL (ref 3.6–5.1)
Alkaline phosphatase (APISO): 77 U/L (ref 36–130)
BUN: 13 mg/dL (ref 7–25)
CO2: 31 mmol/L (ref 20–32)
Calcium: 10.1 mg/dL (ref 8.6–10.3)
Chloride: 101 mmol/L (ref 98–110)
Creat: 0.76 mg/dL (ref 0.60–1.35)
GFR, Est African American: 137 mL/min/{1.73_m2} (ref >=60)
GFR, Est Non African American: 118 mL/min/{1.73_m2} (ref >=60)
Globulin: 2.7 g/dL (calc) (ref 1.9–3.7)
Glucose, Bld: 88 mg/dL (ref 65–99)
Potassium: 4.2 mmol/L (ref 3.5–5.3)
Sodium: 137 mmol/L (ref 135–146)
Total Bilirubin: 0.8 mg/dL (ref 0.2–1.2)
Total Protein: 7.2 g/dL (ref 6.1–8.1)

## 2020-07-26 NOTE — Progress Notes (Signed)
CBC and CMP normal

## 2020-07-31 ENCOUNTER — Other Ambulatory Visit (HOSPITAL_COMMUNITY): Payer: Self-pay

## 2020-08-05 ENCOUNTER — Other Ambulatory Visit (HOSPITAL_COMMUNITY): Payer: Self-pay

## 2020-08-14 ENCOUNTER — Other Ambulatory Visit: Payer: Self-pay | Admitting: Physician Assistant

## 2020-08-14 NOTE — Telephone Encounter (Signed)
Next Visit: 12/26/2020  Last Visit: 07/25/2020  Last Fill: 05/28/2020  DX: Psoriatic arthritis  Current Dose per office note 07/25/2020: Methotrexate 2.5 mg 8 tablets once weekly  Labs: 07/25/2020, CBC and CMP normal.  Okay to refill MTX?

## 2020-08-27 ENCOUNTER — Other Ambulatory Visit (HOSPITAL_COMMUNITY): Payer: Self-pay

## 2020-08-27 ENCOUNTER — Other Ambulatory Visit: Payer: Self-pay | Admitting: Physician Assistant

## 2020-08-27 ENCOUNTER — Telehealth: Payer: Self-pay

## 2020-08-27 DIAGNOSIS — L4050 Arthropathic psoriasis, unspecified: Secondary | ICD-10-CM

## 2020-08-27 MED ORDER — HUMIRA (2 PEN) 40 MG/0.4ML ~~LOC~~ AJKT
AUTO-INJECTOR | SUBCUTANEOUS | 0 refills | Status: DC
Start: 2020-08-27 — End: 2020-11-20
  Filled 2020-08-27: qty 6, fill #0
  Filled 2020-08-29: qty 2, 28d supply, fill #0
  Filled 2020-09-24: qty 2, 28d supply, fill #1
  Filled 2020-10-24: qty 2, 28d supply, fill #2

## 2020-08-27 NOTE — Telephone Encounter (Signed)
Submitted a Prior Authorization request to [facility] for HUMIRA via CoverMyMeds. Will update once we receive a response.   Key: [id number]

## 2020-08-27 NOTE — Telephone Encounter (Signed)
Received notification from [facility] regarding a prior authorization for [place]. Authorization has been APPROVED from 08/27/2020 to 08/27/2021.   Patient can continue fill through [facility] [place]: [phone]   Authorization # [id number]

## 2020-08-27 NOTE — Telephone Encounter (Signed)
Next Visit: 12/26/2020   Last Visit: 07/25/2020   Last Fill: 06/11/2020  DX: Psoriatic arthritis   Current Dose per office note 07/25/2020: Humira 40 mg injection every 14 days  Labs: 07/25/2020, CBC and CMP normal.  TB Gold: 02/28/2020  Per protocol, okay to refill per Dr. Estanislado Pandy

## 2020-08-29 ENCOUNTER — Other Ambulatory Visit (HOSPITAL_COMMUNITY): Payer: Self-pay

## 2020-09-08 ENCOUNTER — Other Ambulatory Visit: Payer: Self-pay | Admitting: Rheumatology

## 2020-09-09 ENCOUNTER — Other Ambulatory Visit (HOSPITAL_COMMUNITY): Payer: Self-pay

## 2020-09-09 NOTE — Telephone Encounter (Signed)
Next Visit: 12/26/2020  Last Visit: 07/25/2020  Last Fill: 09/14/2019  DX: Psoriatic arthritis  Current Dose per office note on [id number]: folic acid 1 mg 2 tablets by mouth daily.  Okay to refill folic acid?

## 2020-09-24 ENCOUNTER — Other Ambulatory Visit (HOSPITAL_COMMUNITY): Payer: Self-pay

## 2020-09-30 ENCOUNTER — Other Ambulatory Visit (HOSPITAL_COMMUNITY): Payer: Self-pay

## 2020-09-30 ENCOUNTER — Other Ambulatory Visit: Payer: Self-pay | Admitting: Rheumatology

## 2020-09-30 NOTE — Telephone Encounter (Signed)
Next Visit: 12/26/2020   Last Visit: 07/25/2020   Last Fill: 07/18/2020  Dx: Psoriatic arthritis    Current Dose per office note on 07/25/2020, Not Discussed  Okay to refill Flexeril?

## 2020-10-15 ENCOUNTER — Other Ambulatory Visit (HOSPITAL_COMMUNITY): Payer: Self-pay

## 2020-10-21 ENCOUNTER — Other Ambulatory Visit (HOSPITAL_COMMUNITY): Payer: Self-pay

## 2020-10-24 ENCOUNTER — Other Ambulatory Visit (HOSPITAL_COMMUNITY): Payer: Self-pay

## 2020-11-12 ENCOUNTER — Other Ambulatory Visit: Payer: Self-pay | Admitting: Physician Assistant

## 2020-11-12 DIAGNOSIS — Z79899 Other long term (current) drug therapy: Secondary | ICD-10-CM

## 2020-11-12 NOTE — Telephone Encounter (Signed)
Next Visit: 12/26/2020   Last Visit: 07/25/2020   Last Fill: 08/14/2020  DX: Psoriatic arthritis   Current Dose per office note on 07/25/2020: Methotrexate 2.5 mg 8 tablets once weekly  Labs: 07/25/2020 CBC and CMP normal.  Patient advised he is due to update labs.   Okay to refill MTX?

## 2020-11-13 ENCOUNTER — Other Ambulatory Visit: Payer: Self-pay | Admitting: *Deleted

## 2020-11-13 DIAGNOSIS — Z79899 Other long term (current) drug therapy: Secondary | ICD-10-CM

## 2020-11-14 LAB — COMPLETE METABOLIC PANEL WITH GFR
AG Ratio: 1.5 (calc) (ref 1.0–2.5)
ALT: 23 U/L (ref 9–46)
AST: 16 U/L (ref 10–40)
Albumin: 4.1 g/dL (ref 3.6–5.1)
Alkaline phosphatase (APISO): 68 U/L (ref 36–130)
BUN: 15 mg/dL (ref 7–25)
CO2: 27 mmol/L (ref 20–32)
Calcium: 9.4 mg/dL (ref 8.6–10.3)
Chloride: 104 mmol/L (ref 98–110)
Creat: 0.75 mg/dL (ref 0.60–1.26)
Globulin: 2.7 g/dL (calc) (ref 1.9–3.7)
Glucose, Bld: 90 mg/dL (ref 65–99)
Potassium: 4.3 mmol/L (ref 3.5–5.3)
Sodium: 141 mmol/L (ref 135–146)
Total Bilirubin: 0.6 mg/dL (ref 0.2–1.2)
Total Protein: 6.8 g/dL (ref 6.1–8.1)
eGFR: 121 mL/min/{1.73_m2} (ref >=60)

## 2020-11-14 LAB — CBC WITH DIFFERENTIAL/PLATELET
Absolute Monocytes: 879 cells/uL (ref 200–950)
Basophils Absolute: 61 cells/uL (ref 0–200)
Basophils Relative: 0.7 %
Eosinophils Absolute: 174 cells/uL (ref 15–500)
Eosinophils Relative: 2 %
HCT: 40 % (ref 38.5–50.0)
Hemoglobin: 13.7 g/dL (ref 13.2–17.1)
Lymphs Abs: 3715 cells/uL (ref 850–3900)
MCH: 32.4 pg (ref 27.0–33.0)
MCHC: 34.3 g/dL (ref 32.0–36.0)
MCV: 94.6 fL (ref 80.0–100.0)
MPV: 9.3 fL (ref 7.5–12.5)
Monocytes Relative: 10.1 %
Neutro Abs: 3872 cells/uL (ref 1500–7800)
Neutrophils Relative %: 44.5 %
Platelets: 268 10*3/uL (ref 140–400)
RBC: 4.23 10*6/uL (ref 4.20–5.80)
RDW: 12.2 % (ref 11.0–15.0)
Total Lymphocyte: 42.7 %
WBC: 8.7 10*3/uL (ref 3.8–10.8)

## 2020-11-14 NOTE — Progress Notes (Signed)
CBC and CMP WNL

## 2020-11-20 ENCOUNTER — Other Ambulatory Visit (HOSPITAL_COMMUNITY): Payer: Self-pay

## 2020-11-20 ENCOUNTER — Other Ambulatory Visit: Payer: Self-pay | Admitting: Rheumatology

## 2020-11-20 DIAGNOSIS — L4050 Arthropathic psoriasis, unspecified: Secondary | ICD-10-CM

## 2020-11-20 MED ORDER — HUMIRA (2 PEN) 40 MG/0.4ML ~~LOC~~ AJKT
AUTO-INJECTOR | SUBCUTANEOUS | 0 refills | Status: DC
  Filled 2020-11-20: qty 6, fill #0
  Filled 2020-12-10: qty 2, 28d supply, fill #0
  Filled 2021-01-06: qty 2, 28d supply, fill #1
  Filled 2021-02-05: qty 2, 28d supply, fill #2

## 2020-11-20 NOTE — Telephone Encounter (Signed)
Next Visit: 12/26/2020   Last Visit: 07/25/2020   Last Fill: 08/27/2020   DX: Psoriatic arthritis   Current Dose per office note on 07/25/2020:  Humira 40 mg injection every 14 days  Labs: 11/13/2020 CBC and CMP WNL  TB Gold: 02/28/2020 Neg   Okay to refill Humira?

## 2020-11-21 ENCOUNTER — Other Ambulatory Visit (HOSPITAL_COMMUNITY): Payer: Self-pay

## 2020-12-09 ENCOUNTER — Other Ambulatory Visit: Payer: Self-pay | Admitting: Physician Assistant

## 2020-12-09 ENCOUNTER — Other Ambulatory Visit: Payer: Self-pay | Admitting: Rheumatology

## 2020-12-09 NOTE — Telephone Encounter (Signed)
Next Visit: 12/26/2020   Last Visit: 07/25/2020  Last Fill: 11/12/2020 (30 day supply)  DX: Psoriatic arthritis   Current Dose per office note 07/25/2020: Methotrexate 2.5 mg 8 tablets once weekly  Labs: 11/13/2020 CBC and CMP WNL  Okay to refill MTX?

## 2020-12-09 NOTE — Telephone Encounter (Signed)
Next Visit: 12/26/2020   Last Visit: 07/25/2020   Last Fill:09/30/2020   Dx: Psoriatic arthritis    Current Dose per office note on 07/25/2020, Not Discussed   Okay to refill Flexeril?

## 2020-12-10 ENCOUNTER — Other Ambulatory Visit (HOSPITAL_COMMUNITY): Payer: Self-pay

## 2020-12-11 ENCOUNTER — Other Ambulatory Visit (HOSPITAL_COMMUNITY): Payer: Self-pay

## 2020-12-12 ENCOUNTER — Other Ambulatory Visit (HOSPITAL_COMMUNITY): Payer: Self-pay

## 2020-12-12 NOTE — Progress Notes (Signed)
Office Visit Note  Patient: Joshua English             Date of Birth: 01/25/1986           MRN: [id number]             PCP: Ferd Hibbs, NP Referring: Ferd Hibbs, NP Visit Date: 12/26/2020 Occupation: @GUAROCC @  Subjective:  Medication management  History of Present Illness: Joshua English is a 35 y.o. male with a history of psoriatic arthritis and psoriasis.  He states that he has been tolerating Humira and methotrexate without any problems.  He continues to have discomfort in his entire spine.  He experiences stiffness in SI joints.  He has not noticed any joint swelling.  He denies any history of psoriasis.  Activities of Daily Living:  Patient reports morning stiffness for 1 hour.   Patient Reports nocturnal pain.  Difficulty dressing/grooming: Denies Difficulty climbing stairs: Denies Difficulty getting out of chair: Denies Difficulty using hands for taps, buttons, cutlery, and/or writing: Denies  Review of Systems  Constitutional:  Negative for fatigue.  HENT:  Negative for mouth dryness.   Eyes:  Negative for dryness.  Respiratory:  Negative for shortness of breath.   Cardiovascular:  Negative for swelling in legs/feet.  Gastrointestinal:  Negative for constipation.  Endocrine: Negative for increased urination.  Genitourinary:  Negative for difficulty urinating.  Musculoskeletal:  Positive for joint pain, joint pain, morning stiffness and muscle tenderness.  Skin:  Negative for rash.  Allergic/Immunologic: Negative for susceptible to infections.  Neurological:  Negative for numbness.  Hematological:  Negative for bruising/bleeding tendency.  Psychiatric/Behavioral:  Positive for sleep disturbance.    PMFS History:  Patient Active Problem List   Diagnosis Date Noted   Psoriatic arthritis ([place]) 02/21/2019   Other psoriasis 02/21/2019   High risk medication use 02/21/2019    Past Medical History:  Diagnosis Date   Psoriatic arthritis ([place])    Scliosis     arthritis -Psoriatic   Scoliosis     Family History  Problem Relation Age of Onset   Heart attack Father    Diabetes Maternal Grandmother    Hypertension Maternal Grandmother    Kidney disease Maternal Grandmother    Past Surgical History:  Procedure Laterality Date   BACK SURGERY  1994   for scoliosis   INSERTION OF MESH N/A 11/09/2016   Procedure: INSERTION OF MESH;  Surgeon: Jovita Kussmaul, MD;  Location: [facility];  Service: General;  Laterality: N/A;   KNEE SURGERY Left    menicus repair    TONSILLECTOMY     UMBILICAL HERNIA REPAIR N/A 11/09/2016   Procedure: HERNIA REPAIR UMBILICAL ADULT;  Surgeon: Autumn Messing III, MD;  Location: [facility];  Service: General;  Laterality: N/A;   Social History   Social History Narrative   Not on file   Immunization History  Administered Date(s) Administered   Influenza-Unspecified 11/09/2017   PFIZER(Purple Top)SARS-COV-2 Vaccination 03/13/2019, 04/17/2019   Tdap 10/03/2015     Objective: Vital Signs: BP 117/77 (BP Location: Left Arm, Patient Position: Sitting, Cuff Size: Normal)   Pulse 97   Resp 15   Ht 5\' 6"  (1.676 m)   Wt 194 lb (88 kg)   BMI 31.31 kg/m    Physical Exam Vitals and nursing note reviewed.  Constitutional:      Appearance: He is well-developed.  HENT:     Head: Normocephalic and atraumatic.  Eyes:     Conjunctiva/sclera: Conjunctivae normal.  Pupils: Pupils are equal, round, and reactive to light.  Cardiovascular:     Rate and Rhythm: Normal rate and regular rhythm.     Heart sounds: Normal heart sounds.  Pulmonary:     Effort: Pulmonary effort is normal.     Breath sounds: Normal breath sounds.  Abdominal:     General: Bowel sounds are normal.     Palpations: Abdomen is soft.  Musculoskeletal:     Cervical back: Normal range of motion and neck supple.  Skin:    General: Skin is warm and dry.     Capillary Refill: Capillary refill takes less than 2 seconds.  Neurological:     Mental Status: He is  alert and oriented to person, place, and time.  Psychiatric:        Behavior: Behavior normal.     Musculoskeletal Exam: C-spine was in good range of motion.  He has no mobility in his thoracic and lumbar spine due to fusion.  Shoulder joints, elbow joints, wrist joints, MCPs PIPs and DIPs with good range of motion with no synovitis.  Hip joints, knee joints, ankles, MTPs with good range of motion with no synovitis.  CDAI Exam: CDAI Score: -- Patient Global: --; Provider Global: -- Swollen: --; Tender: -- Joint Exam 12/26/2020   No joint exam has been documented for this visit   There is currently no information documented on the homunculus. Go to the Rheumatology activity and complete the homunculus joint exam.  Investigation: No additional findings.  Imaging: No results found.  Recent Labs: Lab Results  Component Value Date   WBC 8.7 11/13/2020   HGB 13.7 11/13/2020   PLT 268 11/13/2020   NA 141 11/13/2020   K 4.3 11/13/2020   CL 104 11/13/2020   CO2 27 11/13/2020   GLUCOSE 90 11/13/2020   BUN 15 11/13/2020   CREATININE 0.75 11/13/2020   BILITOT 0.6 11/13/2020   AST 16 11/13/2020   ALT 23 11/13/2020   PROT 6.8 11/13/2020   CALCIUM 9.4 11/13/2020   GFRAA 137 07/25/2020   QFTBGOLDPLUS NEGATIVE 02/28/2020    Speciality Comments: Treatment by Dr. Dionisio Paschal response, Remicade-insurance issues  Procedures:  No procedures performed Allergies: Patient has no known allergies.   Assessment / Plan:     Visit Diagnoses: Psoriatic arthritis ([place]) - Diagnosed while he was in college.  Previous patient of Dr. Dossie Der.  (Enbrel -inadequate response, Remicade discontinued due to insurance issues): Humira was started in February 2021.  He had interruption in the therapy but he has been taking Humira on a regular basis since May 2022.  He has been on methotrexate for many years.  The combination of methotrexate and Humira has been working well.  He had no synovitis on  examination.  Psoriasis-he had no active psoriasis lesions.  High risk medication use - Humira 40 mg injection every 14 days, Methotrexate 2.5 mg 8 tablets once weekly, and folic acid 1 mg 2 tablets by mouth daily. -Labs obtained on November 13, 2020 were reviewed.  CBC with differential is normal and CMP with GFR was normal.  TB gold was negative on February 28, 2020.  We will recheck CBC and CMP with GFR along with TB Gold in January.  We will get labs every 3 months to monitor for drug toxicity.  TB gold will be yearly.  He was advised to stop Humira and methotrexate in case he develops an infection restarts once infection resolves.  Information regarding immunization was also placed  in the AVS.  Plan: QuantiFERON-TB Gold Plus  Primary osteoarthritis of both hands-he has osteoarthritis in his hands with some discomfort.  Primary osteoarthritis of both feet-he continues to have some stiffness in his feet.  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  Essential hypertension-blood pressure is normal today.  Increased risk of heart disease with psoriatic arthritis was discussed.  Need for regular exercise and dietary modifications were discussed and a handout was placed in the AVS.  Orders: Orders Placed This Encounter  Procedures   QuantiFERON-TB Gold Plus   No orders of the defined types were placed in this encounter.  .  Follow-Up Instructions: Return in about 5 months (around 05/26/2021) for Psoriatic arthritis, Osteoarthritis.   Bo Merino, MD  Note - This record has been created using Editor, commissioning.  Chart creation errors have been sought, but may not always  have been located. Such creation errors do not reflect on  the standard of medical care.

## 2020-12-26 ENCOUNTER — Other Ambulatory Visit: Payer: Self-pay

## 2020-12-26 ENCOUNTER — Ambulatory Visit (INDEPENDENT_AMBULATORY_CARE_PROVIDER_SITE_OTHER): Payer: Medicaid Other | Admitting: Rheumatology

## 2020-12-26 ENCOUNTER — Encounter: Payer: Self-pay | Admitting: Rheumatology

## 2020-12-26 VITALS — BP 117/77 | HR 97 | Resp 15 | Ht 66.0 in | Wt 194.0 lb

## 2020-12-26 DIAGNOSIS — L4050 Arthropathic psoriasis, unspecified: Secondary | ICD-10-CM

## 2020-12-26 DIAGNOSIS — L409 Psoriasis, unspecified: Secondary | ICD-10-CM | POA: Diagnosis not present

## 2020-12-26 DIAGNOSIS — Z981 Arthrodesis status: Secondary | ICD-10-CM

## 2020-12-26 DIAGNOSIS — G8929 Other chronic pain: Secondary | ICD-10-CM

## 2020-12-26 DIAGNOSIS — I10 Essential (primary) hypertension: Secondary | ICD-10-CM

## 2020-12-26 DIAGNOSIS — M19072 Primary osteoarthritis, left ankle and foot: Secondary | ICD-10-CM

## 2020-12-26 DIAGNOSIS — M19041 Primary osteoarthritis, right hand: Secondary | ICD-10-CM | POA: Diagnosis not present

## 2020-12-26 DIAGNOSIS — Z79899 Other long term (current) drug therapy: Secondary | ICD-10-CM | POA: Diagnosis not present

## 2020-12-26 DIAGNOSIS — M19071 Primary osteoarthritis, right ankle and foot: Secondary | ICD-10-CM

## 2020-12-26 DIAGNOSIS — M533 Sacrococcygeal disorders, not elsewhere classified: Secondary | ICD-10-CM

## 2020-12-26 DIAGNOSIS — M19042 Primary osteoarthritis, left hand: Secondary | ICD-10-CM

## 2020-12-26 NOTE — Patient Instructions (Signed)
Standing Labs We placed an order today for your standing lab work.   Please have your standing labs drawn in January and every 3 months  TB Gold in January  If possible, please have your labs drawn 2 weeks prior to your appointment so that the provider can discuss your results at your appointment.  Please note that you may see your imaging and lab results in [facility] before we have reviewed them. We may be awaiting multiple results to interpret others before contacting you. Please allow our office up to 72 hours to thoroughly review all of the results before contacting the office for clarification of your results.  We have open lab daily: Monday through Thursday from 1:30-4:30 PM and Friday from 1:30-4:00 PM at the office of Dr. Bo Merino, [facility].   Please be advised, all patients with office appointments requiring lab work will take precedent over walk-in lab work.  If possible, please come for your lab work on Monday and Friday afternoons, as you may experience shorter wait times. The office is located at [address], [place], [place], [place] [address] No appointment is necessary.   Labs are drawn by [facility]. Please bring your co-pay at the time of your lab draw.  You may receive a bill from [facility] for your lab work.  If you wish to have your labs drawn at another location, please call the office 24 hours in advance to send orders.  If you have any questions regarding directions or hours of operation,  please call [phone].   As a reminder, please drink plenty of water prior to coming for your lab work. Thanks!   Vaccines You are taking a medication(s) that can suppress your immune system.  The following immunizations are recommended: Flu annually Covid-19  Td/Tdap (tetanus, diphtheria, pertussis) every 10 years Pneumonia (Prevnar 15 then Pneumovax 23 at least 1 year apart.  Alternatively, can take Prevnar 20 without needing additional  dose) Shingrix: 2 doses from 4 weeks to 6 months apart  Please check with your PCP to make sure you are up to date.   If you have signs or symptoms of an infection or start antibiotics: First, call your PCP for workup of your infection. Hold your medication through the infection, until you complete your antibiotics, and until symptoms resolve if you take the following: Injectable medication (Actemra, Benlysta, Cimzia, Cosentyx, Enbrel, Humira, Kevzara, Orencia, Remicade, Simponi, Stelara, Taltz, Tremfya) Methotrexate Leflunomide (Arava) Mycophenolate (Cellcept) Roma Kayser, or Rinvoq   The recommendations are to get annual skin examination by dermatologist to screen for skin cancer while you are on Humira.   Heart Disease Prevention   Your inflammatory disease increases your risk of heart disease which includes heart attack, stroke, atrial fibrillation (irregular heartbeats), high blood pressure, heart failure and atherosclerosis (plaque in the arteries).  It is important to reduce your risk by:   Keep blood pressure, cholesterol, and blood sugar at healthy levels   Smoking Cessation   Maintain a healthy weight  BMI 20-25   Eat a healthy diet  Plenty of fresh fruit, vegetables, and whole grains  Limit saturated fats, foods high in sodium, and added sugars  DASH and Mediterranean diet   Increase physical activity  Recommend moderate physically activity for 150 minutes per week/ 30 minutes a day for five days a week These can be broken up into three separate ten-minute sessions during the day.   Reduce Stress  Meditation, slow breathing exercises, yoga, coloring books  Dental visits twice a year

## 2020-12-26 NOTE — Addendum Note (Signed)
Addended by: Carole Binning on: 12/26/2020 03:31 PM   Modules accepted: Orders

## 2021-01-06 ENCOUNTER — Other Ambulatory Visit (HOSPITAL_COMMUNITY): Payer: Self-pay

## 2021-01-09 ENCOUNTER — Other Ambulatory Visit (HOSPITAL_COMMUNITY): Payer: Self-pay

## 2021-01-14 ENCOUNTER — Other Ambulatory Visit (HOSPITAL_COMMUNITY): Payer: Self-pay

## 2021-01-17 ENCOUNTER — Other Ambulatory Visit (HOSPITAL_COMMUNITY): Payer: Self-pay

## 2021-02-05 ENCOUNTER — Other Ambulatory Visit (HOSPITAL_COMMUNITY): Payer: Self-pay

## 2021-02-06 ENCOUNTER — Other Ambulatory Visit (HOSPITAL_COMMUNITY): Payer: Self-pay

## 2021-02-17 ENCOUNTER — Other Ambulatory Visit: Payer: Self-pay

## 2021-02-17 ENCOUNTER — Ambulatory Visit: Payer: Medicaid Other | Admitting: Dermatology

## 2021-02-17 DIAGNOSIS — B079 Viral wart, unspecified: Secondary | ICD-10-CM

## 2021-02-17 DIAGNOSIS — D1801 Hemangioma of skin and subcutaneous tissue: Secondary | ICD-10-CM

## 2021-02-17 DIAGNOSIS — D229 Melanocytic nevi, unspecified: Secondary | ICD-10-CM

## 2021-02-17 DIAGNOSIS — L814 Other melanin hyperpigmentation: Secondary | ICD-10-CM

## 2021-02-17 DIAGNOSIS — D485 Neoplasm of uncertain behavior of skin: Secondary | ICD-10-CM

## 2021-02-17 DIAGNOSIS — Z1283 Encounter for screening for malignant neoplasm of skin: Secondary | ICD-10-CM | POA: Diagnosis not present

## 2021-02-17 DIAGNOSIS — L821 Other seborrheic keratosis: Secondary | ICD-10-CM

## 2021-02-17 DIAGNOSIS — B360 Pityriasis versicolor: Secondary | ICD-10-CM

## 2021-02-17 DIAGNOSIS — L578 Other skin changes due to chronic exposure to nonionizing radiation: Secondary | ICD-10-CM

## 2021-02-17 DIAGNOSIS — L4050 Arthropathic psoriasis, unspecified: Secondary | ICD-10-CM | POA: Diagnosis not present

## 2021-02-17 MED ORDER — KETOCONAZOLE 2 % EX SHAM
1.0000 | MEDICATED_SHAMPOO | CUTANEOUS | 6 refills | Status: AC
Start: 2021-02-17 — End: ?

## 2021-02-17 NOTE — Patient Instructions (Addendum)

## 2021-02-17 NOTE — Progress Notes (Signed)
New Patient Visit  Subjective  Joshua English is a 36 y.o. male who presents for the following: Other (New patient - Psoriatic arthritis - He is on Humira and Methotrexate. He had some skin involvement of scalp and face before starting medications but is clear now. His rheumatologist recommended that he have a skin exam.). The patient presents for Total-Body Skin Exam (TBSE) for skin cancer screening and mole check.  The patient has spots, moles and lesions to be evaluated, some may be new or changing and the patient has concerns that these could be cancer.  The following portions of the chart were reviewed this encounter and updated as appropriate:   Tobacco   Allergies   Meds   Problems   Med Hx   Surg Hx   Fam Hx      Review of Systems:  No other skin or systemic complaints except as noted in HPI or Assessment and Plan.  Objective  Well appearing patient in no apparent distress; mood and affect are within normal limits.  A full examination was performed including scalp, head, eyes, ears, nose, lips, neck, chest, axillae, abdomen, back, buttocks, bilateral upper extremities, bilateral lower extremities, hands, feet, fingers, toes, fingernails, and toenails. All findings within normal limits unless otherwise noted below.  Pink patchy scaliness of scalp, neck and chest  Right Upper Back Brown papule  Right Knee - Anterior 0.7 cm verrucous papule -- Discussed viral etiology and contagion.    Assessment & Plan   Lentigines - Scattered tan macules - Due to sun exposure - Benign-appearing, observe - Recommend daily broad spectrum sunscreen SPF 30+ to sun-exposed areas, reapply every 2 hours as needed. - Call for any changes  Seborrheic Keratoses - Stuck-on, waxy, tan-brown papules and/or plaques  - Benign-appearing - Discussed benign etiology and prognosis. - Observe - Call for any changes  Melanocytic Nevi - Tan-brown and/or pink-flesh-colored symmetric macules and  papules - Benign appearing on exam today - Observation - Call clinic for new or changing moles - Recommend daily use of broad spectrum spf 30+ sunscreen to sun-exposed areas.   Hemangiomas - Red papules - Discussed benign nature - Observe - Call for any changes  Actinic Damage - Chronic condition, secondary to cumulative UV/sun exposure - diffuse scaly erythematous macules with underlying dyspigmentation - Recommend daily broad spectrum sunscreen SPF 30+ to sun-exposed areas, reapply every 2 hours as needed.  - Staying in the shade or wearing long sleeves, sun glasses (UVA+UVB protection) and wide brim hats (4-inch brim around the entire circumference of the hat) are also recommended for sun protection.  - Call for new or changing lesions.  Skin cancer screening performed today.  Psoriatic arthritis (HCC)  Related Medications Adalimumab (HUMIRA PEN) 40 MG/0.4ML PNKT INJECT 40 MG INTO THE SKIN EVERY 14 DAYS.  Tinea versicolor  Tinea versicolor is a chronic recurrent skin rash causing discolored scaly spots most commonly seen on back, chest, and/or shoulders.  It is generally asymptomatic. The rash is due to overgrowth of a common type of yeast present on everyone's skin and it is not contagious.  It tends to flare more in the summer due to increased sweating on trunk.  After rash is treated, the scaliness will resolve, but the discoloration will take longer to return to normal pigmentation. The periodic use of an OTC medicated soap/shampoo with zinc or selenium sulfide can be helpful to prevent yeast overgrowth and recurrence.   Start Ketoconazole 2% shampoo 2-3 times per week  ketoconazole (NIZORAL) 2 % shampoo Apply 1 application topically as directed. Wash scalp and trunk 2-3 times per week  Neoplasm of uncertain behavior of skin Right Upper Back  Will plan shave removal on follow up  Viral warts, unspecified type Right Knee - Anterior  Destruction of lesion - Right Knee  - Anterior Complexity: simple   Destruction method: cryotherapy   Informed consent: discussed and consent obtained   Timeout:  patient name, date of birth, surgical site, and procedure verified Lesion destroyed using liquid nitrogen: Yes   Region frozen until ice ball extended beyond lesion: Yes   Outcome: patient tolerated procedure well with no complications   Post-procedure details: wound care instructions given    Skin cancer screening   Return in about 2 months (around 04/17/2021) for TV follow up and biopsy.  I, Ashok Cordia, CMA, am acting as scribe for Sarina Ser, MD . Documentation: I have reviewed the above documentation for accuracy and completeness, and I agree with the above.  Sarina Ser, MD

## 2021-02-18 ENCOUNTER — Encounter: Payer: Self-pay | Admitting: Dermatology

## 2021-03-03 ENCOUNTER — Other Ambulatory Visit (HOSPITAL_COMMUNITY): Payer: Self-pay

## 2021-03-03 ENCOUNTER — Other Ambulatory Visit: Payer: Self-pay | Admitting: Rheumatology

## 2021-03-03 DIAGNOSIS — L4050 Arthropathic psoriasis, unspecified: Secondary | ICD-10-CM

## 2021-03-03 MED ORDER — HUMIRA (2 PEN) 40 MG/0.4ML ~~LOC~~ AJKT
AUTO-INJECTOR | SUBCUTANEOUS | 0 refills | Status: DC
Start: 2021-03-03 — End: 2021-06-05
  Filled 2021-03-03: qty 2, 28d supply, fill #0
  Filled 2021-04-16: qty 2, 28d supply, fill #1
  Filled 2021-05-13: qty 2, 28d supply, fill #2

## 2021-03-03 NOTE — Telephone Encounter (Signed)
Next Visit: 05/29/2021  Last Visit: 12/26/2020  Last Fill: 11/20/2020  ID:[id number] arthritis   Current Dose per office note on 12/26/2020: Humira 40 mg injection every 14 days,  Labs: 11/13/2020 CBC and CMP WNL  TB Gold: 02/28/2020 negative    Advised patient he is due for labs, patient states he plans to come tomorrow for labs.  I advised him to call prior to coming to ensure we have a lab tech.   Okay to refill humira?

## 2021-03-04 ENCOUNTER — Other Ambulatory Visit: Payer: Self-pay | Admitting: Rheumatology

## 2021-03-04 ENCOUNTER — Other Ambulatory Visit: Payer: Self-pay | Admitting: *Deleted

## 2021-03-04 ENCOUNTER — Other Ambulatory Visit: Payer: Self-pay | Admitting: Physician Assistant

## 2021-03-04 DIAGNOSIS — Z79899 Other long term (current) drug therapy: Secondary | ICD-10-CM

## 2021-03-04 NOTE — Telephone Encounter (Signed)
Next Visit: 05/29/2021  Last Visit: 12/26/2020  Last Fill: 12/09/2020  Dx: Psoriatic arthritis   Current Dose per office note on 12/26/2020: not discussed.  Okay to refill Flexeril?

## 2021-03-04 NOTE — Telephone Encounter (Signed)
Next Visit: 05/29/2021   Last Visit: 12/26/2020   Last Fill: 12/09/2020   [id number] arthritis    Current Dose per office note on 12/26/2020: Methotrexate 2.5 mg 8 tablets once weekly  Labs: 11/13/2020 CBC and CMP WNL (Patient updated labs today)  Okay to refill MTX?

## 2021-03-05 NOTE — Progress Notes (Signed)
CMP WNL.  WBC count is elevated-absolute neutrophils and absolute monocytes are elevated. Please clarify if he has had any recent infections.

## 2021-03-06 ENCOUNTER — Other Ambulatory Visit (HOSPITAL_COMMUNITY): Payer: Self-pay

## 2021-03-07 LAB — CBC WITH DIFFERENTIAL/PLATELET
Absolute Monocytes: 1174 cells/uL — ABNORMAL HIGH (ref 200–950)
Basophils Absolute: 65 cells/uL (ref 0–200)
Basophils Relative: 0.5 %
Eosinophils Absolute: 116 cells/uL (ref 15–500)
Eosinophils Relative: 0.9 %
HCT: 43.4 % (ref 38.5–50.0)
Hemoglobin: 14.7 g/dL (ref 13.2–17.1)
Lymphs Abs: 2735 cells/uL (ref 850–3900)
MCH: 32.2 pg (ref 27.0–33.0)
MCHC: 33.9 g/dL (ref 32.0–36.0)
MCV: 95 fL (ref 80.0–100.0)
MPV: 9.7 fL (ref 7.5–12.5)
Monocytes Relative: 9.1 %
Neutro Abs: 8811 cells/uL — ABNORMAL HIGH (ref 1500–7800)
Neutrophils Relative %: 68.3 %
Platelets: 298 10*3/uL (ref 140–400)
RBC: 4.57 10*6/uL (ref 4.20–5.80)
RDW: 12.3 % (ref 11.0–15.0)
Total Lymphocyte: 21.2 %
WBC: 12.9 10*3/uL — ABNORMAL HIGH (ref 3.8–10.8)

## 2021-03-07 LAB — QUANTIFERON-TB GOLD PLUS
Mitogen-NIL: >10 IU/mL
NIL: 0.03 IU/mL
QuantiFERON-TB Gold Plus: NEGATIVE
TB1-NIL: 0.01 IU/mL
TB2-NIL: 0.01 IU/mL

## 2021-03-07 LAB — COMPLETE METABOLIC PANEL WITH GFR
AG Ratio: 1.5 (calc) (ref 1.0–2.5)
ALT: 38 U/L (ref 9–46)
AST: 31 U/L (ref 10–40)
Albumin: 4.6 g/dL (ref 3.6–5.1)
Alkaline phosphatase (APISO): 72 U/L (ref 36–130)
BUN: 11 mg/dL (ref 7–25)
CO2: 33 mmol/L — ABNORMAL HIGH (ref 20–32)
Calcium: 9.9 mg/dL (ref 8.6–10.3)
Chloride: 102 mmol/L (ref 98–110)
Creat: 0.87 mg/dL (ref 0.60–1.26)
Globulin: 3 g/dL (calc) (ref 1.9–3.7)
Glucose, Bld: 76 mg/dL (ref 65–99)
Potassium: 4.5 mmol/L (ref 3.5–5.3)
Sodium: 139 mmol/L (ref 135–146)
Total Bilirubin: 0.6 mg/dL (ref 0.2–1.2)
Total Protein: 7.6 g/dL (ref 6.1–8.1)
eGFR: 115 mL/min/{1.73_m2} (ref >=60)

## 2021-03-07 NOTE — Progress Notes (Signed)
TB Gold is negative.

## 2021-03-10 ENCOUNTER — Telehealth: Payer: Self-pay | Admitting: Rheumatology

## 2021-03-10 NOTE — Telephone Encounter (Signed)
Patient called the office stating he has been trying to get his Methotrexate filled for a week. Patient states we told him he needs labs before we could send in a refill and he had them done on 1/24. Told patient a refill was sent in on 1/24 and it was confirmed by pharmacy on 1/24. Patient states he spoke with the pharmacy before he called [place] and they told him they did not have the prescription. Patient requests for someone to call and see what the problem is.

## 2021-03-10 NOTE — Telephone Encounter (Signed)
Contacted pharmacy on behalf of the patient. Pharmacy states they have received the prescription but did not have enough medication to fill the entire prescription. Pharmacy states they gave the patient a weeks worth of medication and advised him they would fill the remaining amount when they received the medication. Pharmacy states they will have the medication in on Wednesday. Called patient and advised. Patient expressed understanding.

## 2021-04-01 ENCOUNTER — Other Ambulatory Visit: Payer: Self-pay | Admitting: Rheumatology

## 2021-04-02 NOTE — Telephone Encounter (Signed)
Next Visit: 05/29/2021   Last Visit: 12/26/2020   Last Fill: 03/04/2021   Dx: Psoriatic arthritis    Current Dose per office note on 12/26/2020: not discussed.   Okay to refill Flexeril?

## 2021-04-04 ENCOUNTER — Other Ambulatory Visit (HOSPITAL_COMMUNITY): Payer: Self-pay

## 2021-04-09 HISTORY — PX: MOLE REMOVAL: SHX2046

## 2021-04-16 ENCOUNTER — Other Ambulatory Visit (HOSPITAL_COMMUNITY): Payer: Self-pay

## 2021-04-21 ENCOUNTER — Ambulatory Visit: Payer: Medicaid Other | Admitting: Dermatology

## 2021-04-23 ENCOUNTER — Other Ambulatory Visit (HOSPITAL_COMMUNITY): Payer: Self-pay

## 2021-05-02 ENCOUNTER — Other Ambulatory Visit: Payer: Self-pay | Admitting: Rheumatology

## 2021-05-02 NOTE — Telephone Encounter (Signed)
Next Visit: 05/29/2021 ?  ?Last Visit: 12/26/2020 ?  ?Last Fill: 04/02/2021 ?  ?Dx: Psoriatic arthritis  ?  ?Current Dose per office note on 12/26/2020: not discussed. ?  ?Okay to refill Flexeril?   ?

## 2021-05-12 ENCOUNTER — Other Ambulatory Visit: Payer: Self-pay

## 2021-05-12 MED ORDER — METHOTREXATE SODIUM 2.5 MG PO TABS: 20.0000 mg | ORAL_TABLET | ORAL | 0 refills | Status: DC

## 2021-05-12 NOTE — Telephone Encounter (Signed)
Next Visit: 05/29/2021 ? ?Last Visit: 12/26/2021 ? ?Last Fill: 03/04/2021 ? ?DX: Psoriatic arthritis ? ?Current Dose per office note 12/26/2021: Methotrexate 2.5 mg 8 tablets once weekly ? ?Labs: 03/04/2021 CMP WNL.  WBC count is elevated-absolute neutrophils and absolute monocytes are elevated. ? ?Okay to refill MTX?  ?

## 2021-05-12 NOTE — Telephone Encounter (Signed)
Patient called requesting prescription refill of Methotrexate to be sent to be sent to [facility] at [address] ?

## 2021-05-13 ENCOUNTER — Other Ambulatory Visit (HOSPITAL_COMMUNITY): Payer: Self-pay

## 2021-05-14 ENCOUNTER — Ambulatory Visit (INDEPENDENT_AMBULATORY_CARE_PROVIDER_SITE_OTHER): Payer: Medicaid Other | Admitting: Dermatology

## 2021-05-14 DIAGNOSIS — B360 Pityriasis versicolor: Secondary | ICD-10-CM

## 2021-05-14 DIAGNOSIS — L905 Scar conditions and fibrosis of skin: Secondary | ICD-10-CM | POA: Diagnosis not present

## 2021-05-14 DIAGNOSIS — D225 Melanocytic nevi of trunk: Secondary | ICD-10-CM | POA: Diagnosis not present

## 2021-05-14 DIAGNOSIS — D229 Melanocytic nevi, unspecified: Secondary | ICD-10-CM

## 2021-05-14 DIAGNOSIS — D485 Neoplasm of uncertain behavior of skin: Secondary | ICD-10-CM

## 2021-05-14 DIAGNOSIS — D239 Other benign neoplasm of skin, unspecified: Secondary | ICD-10-CM

## 2021-05-14 HISTORY — DX: Other benign neoplasm of skin, unspecified: D23.9

## 2021-05-14 NOTE — Progress Notes (Signed)
? ?  Follow-Up Visit ?  ?Subjective  ?Joshua English is a 36 y.o. male who presents for the following: Irregular nevus (Patient is here today for shave removal and biopsy on the R upper back. ) and Tinea versicolor (Patient currently using Ketoconazole 2% shampoo to trunk and extremities. ). ? ?The following portions of the chart were reviewed this encounter and updated as appropriate:  ? Tobacco  Allergies  Meds  Problems  Med Hx  Surg Hx  Fam Hx   ?  ?Review of Systems:  No other skin or systemic complaints except as noted in HPI or Assessment and Plan. ? ?Objective  ?Well appearing patient in no apparent distress; mood and affect are within normal limits. ? ?A focused examination was performed including the trunk and extremities. Relevant physical exam findings are noted in the Assessment and Plan. ? ?Mid Back ?Clear.  ? ?Right Upper Back ?1.2 x 0.7 cm irregular brown macule. ? ? ? ? ? ?Assessment & Plan  ?Tinea versicolor ?Mid Back ? ?Tinea versicolor is a chronic recurrent skin rash causing discolored scaly spots most commonly seen on back, chest, and/or shoulders.  It is generally asymptomatic. The rash is due to overgrowth of a common type of yeast present on everyone's skin and it is not contagious.  It tends to flare more in the summer due to increased sweating on trunk.  After rash is treated, the scaliness will resolve, but the discoloration will take longer to return to normal pigmentation. The periodic use of an OTC medicated soap/shampoo with zinc or selenium sulfide can be helpful to prevent yeast overgrowth and recurrence. ? ?Continue Ketoconazole 2% shampoo QM to prevent recurrence.  ? ?Related Medications ?ketoconazole (NIZORAL) 2 % shampoo ?Apply 1 application topically as directed. Wash scalp and trunk 2-3 times per week ? ?Neoplasm of uncertain behavior of skin ?Right Upper Back ? ?Epidermal / dermal shaving ? ?Lesion diameter (cm):  1.2 ?Informed consent: discussed and consent obtained    ?Timeout: patient name, date of birth, surgical site, and procedure verified   ?Procedure prep:  Patient was prepped and draped in usual sterile fashion ?Prep type:  Isopropyl alcohol ?Anesthesia: the lesion was anesthetized in a standard fashion   ?Anesthetic:  1% lidocaine w/ epinephrine 1-100,000 buffered w/ 8.4% NaHCO3 ?Instrument used: flexible razor blade   ?Hemostasis achieved with: pressure, aluminum chloride and electrodesiccation   ?Outcome: patient tolerated procedure well   ?Post-procedure details: sterile dressing applied and wound care instructions given   ?Dressing type: bandage and petrolatum   ? ?Specimen 1 - Surgical pathology ?Differential Diagnosis: D48.5 r/o dysplastic nevus  ?Check Margins: No ? ?Melanocytic Nevi ?- Tan-brown and/or pink-flesh-colored symmetric macules and papules ?- Benign appearing on exam today ?- Observation ?- Call clinic for new or changing moles ?- Recommend daily use of broad spectrum spf 30+ sunscreen to sun-exposed areas.  ? ?Return in about 1 year (around 05/15/2022) for TBSE. ? ?I, Rudell Cobb, CMA, am acting as scribe for Sarina Ser, MD . ?Documentation: I have reviewed the above documentation for accuracy and completeness, and I agree with the above. ? ?Sarina Ser, MD ? ? ?

## 2021-05-14 NOTE — Patient Instructions (Addendum)

## 2021-05-15 NOTE — Progress Notes (Signed)
? ?Office Visit Note ? ?Patient: Joshua English             ?Date of Birth: 20-May-1985           ?MRN: [id number]             ?PCP: Ferd Hibbs, NP ?Referring: Ferd Hibbs, NP ?Visit Date: 05/29/2021 ?Occupation: '@GUAROCC'$ @ ? ?Subjective:  ?Medication monitoring  ? ? ?History of Present Illness: Joshua English is a 36 y.o. male with history of psoriatic arthritis and osteoarthritis. He remains on Humira 40 mg sq injections every 14 days, Methotrexate 2.5 mg 8 tablets once weekly, and folic acid 1 mg 2 tablets by mouth daily.  He continues to tolerate combination therapy without any side effects, injection site reactions, recurrent infections.  He denies any signs or symptoms of a psoriatic arthritis flare.  He denies any active psoriasis at this time.  He denies any Achilles tendinitis or planter fasciitis.  He has chronic neck and back pain which he attributes to scoliosis.  He takes Flexeril 10 mg 1 tablet at bedtime for muscle spasms.  He denies any increased SI joint discomfort.  He denies any other new medical conditions or concerns at this time. ? ? ? ?Activities of Daily Living:  ?Patient reports morning stiffness for 20-30 minutes.   ?Patient Reports nocturnal pain.  ?Difficulty dressing/grooming: Denies ?Difficulty climbing stairs: Denies ?Difficulty getting out of chair: Denies ?Difficulty using hands for taps, buttons, cutlery, and/or writing: Denies ? ?Review of Systems  ?Constitutional:  Negative for fatigue.  ?HENT:  Negative for mouth sores, mouth dryness and nose dryness.   ?Eyes:  Negative for pain, itching and dryness.  ?Respiratory:  Negative for shortness of breath and difficulty breathing.   ?Cardiovascular:  Negative for chest pain and palpitations.  ?Gastrointestinal:  Negative for blood in stool, constipation and diarrhea.  ?Endocrine: Negative for increased urination.  ?Genitourinary:  Negative for difficulty urinating.  ?Musculoskeletal:  Positive for joint pain, joint pain, joint  swelling and morning stiffness. Negative for myalgias, muscle tenderness and myalgias.  ?Skin:  Negative for color change, rash and redness.  ?Allergic/Immunologic: Negative for susceptible to infections.  ?Neurological:  Negative for dizziness, numbness, headaches, memory loss and weakness.  ?Hematological:  Negative for bruising/bleeding tendency.  ? ?PMFS History:  ?Patient Active Problem List  ? Diagnosis Date Noted  ? Psoriatic arthritis ([place]) 02/21/2019  ? Other psoriasis 02/21/2019  ? High risk medication use 02/21/2019  ?  ?Past Medical History:  ?Diagnosis Date  ? Dysplastic nevus 05/14/2021  ? R upper back - moderate  ? Psoriatic arthritis ([place])   ? Scliosis   ? arthritis -Psoriatic  ? Scoliosis   ?  ?Family History  ?Problem Relation Age of Onset  ? Heart attack Father   ? Diabetes Maternal Grandmother   ? Hypertension Maternal Grandmother   ? Kidney disease Maternal Grandmother   ? ?Past Surgical History:  ?Procedure Laterality Date  ? BACK SURGERY  1994  ? for scoliosis  ? INSERTION OF MESH N/A 11/09/2016  ? Procedure: INSERTION OF MESH;  Surgeon: Jovita Kussmaul, MD;  Location: [facility];  Service: General;  Laterality: N/A;  ? KNEE SURGERY Left   ? menicus repair   ? MOLE REMOVAL  04/2021  ? TONSILLECTOMY    ? UMBILICAL HERNIA REPAIR N/A 11/09/2016  ? Procedure: HERNIA REPAIR UMBILICAL ADULT;  Surgeon: Jovita Kussmaul, MD;  Location: [facility];  Service: General;  Laterality: N/A;  ? ?Social History  ? ?  Social History Narrative  ? Not on file  ? ?Immunization History  ?Administered Date(s) Administered  ? Influenza-Unspecified 11/09/2017  ? PFIZER(Purple Top)SARS-COV-2 Vaccination 03/13/2019, 04/17/2019  ? Tdap 10/03/2015  ?  ? ?Objective: ?Vital Signs: BP (!) 133/91 (BP Location: Right Arm, Patient Position: Sitting, Cuff Size: Normal)   Pulse 92   Ht '5\' 6"'$  (1.676 m)   Wt 197 lb 6.4 oz (89.5 kg)   BMI 31.86 kg/m?   ? ?Physical Exam ?Vitals and nursing note reviewed.  ?Constitutional:   ?   Appearance: He  is well-developed.  ?HENT:  ?   Head: Normocephalic and atraumatic.  ?Eyes:  ?   Conjunctiva/sclera: Conjunctivae normal.  ?   Pupils: Pupils are equal, round, and reactive to light.  ?Cardiovascular:  ?   Rate and Rhythm: Normal rate and regular rhythm.  ?   Heart sounds: Normal heart sounds.  ?Pulmonary:  ?   Effort: Pulmonary effort is normal.  ?   Breath sounds: Normal breath sounds.  ?Abdominal:  ?   General: Bowel sounds are normal.  ?   Palpations: Abdomen is soft.  ?Musculoskeletal:  ?   Cervical back: Normal range of motion and neck supple.  ?Skin: ?   General: Skin is warm and dry.  ?   Capillary Refill: Capillary refill takes less than 2 seconds.  ?Neurological:  ?   Mental Status: He is alert and oriented to person, place, and time.  ?Psychiatric:     ?   Behavior: Behavior normal.  ?  ? ?Musculoskeletal Exam: C-spine has good range of motion with no discomfort.  Severe thoracolumbar scoliosis.  No SI joint tenderness. Shoulder joints, elbow joints, wrist joints, MCPs, PIPs, and DIPs good ROM with no synovitis.  Complete fist formation bilaterally. Hip joints, knee joints, and ankle joints have good ROM with no discomfort.  No warmth or effusion of knee joints.  No tenderness or swelling of ankle joints.  No evidence of Achilles tendinitis or plantar fasciitis. ? ?CDAI Exam: ?CDAI Score: -- ?Patient Global: --; Provider Global: -- ?Swollen: --; Tender: -- ?Joint Exam 05/29/2021  ? ?No joint exam has been documented for this visit  ? ?There is currently no information documented on the homunculus. Go to the Rheumatology activity and complete the homunculus joint exam. ? ?Investigation: ?No additional findings. ? ?Imaging: ?No results found. ? ?Recent Labs: ?Lab Results  ?Component Value Date  ? WBC 12.9 (H) 03/04/2021  ? HGB 14.7 03/04/2021  ? PLT 298 03/04/2021  ? NA 139 03/04/2021  ? K 4.5 03/04/2021  ? CL 102 03/04/2021  ? CO2 33 (H) 03/04/2021  ? GLUCOSE 76 03/04/2021  ? BUN 11 03/04/2021  ?  CREATININE 0.87 03/04/2021  ? BILITOT 0.6 03/04/2021  ? AST 31 03/04/2021  ? ALT 38 03/04/2021  ? PROT 7.6 03/04/2021  ? CALCIUM 9.9 03/04/2021  ? GFRAA 137 07/25/2020  ? QFTBGOLDPLUS NEGATIVE 03/04/2021  ? ? ?Speciality Comments: Treatment by Dr. Dionisio Paschal response, Remicade-insurance issues ?Humira start-02/21 ? ?Procedures:  ?No procedures performed ?Allergies: Patient has no known allergies.  ? ?Assessment / Plan:     ?Visit Diagnoses: Psoriatic arthritis ([place]) - Diagnosed while he was in college.  Previous patient of Dr. Dossie Der.  (Enbrel -inadequate response, Remicade discontinued due to insurance issues): He has no synovitis or dactylitis on examination.  He has not had any signs or symptoms of a psoriatic arthritis flare since his last office visit.  He is clinically doing well on  Humira 40 mg sq injections every 14 days and methotrexate 8 tablets by mouth once weekly.  He continues to tolerate combination therapy without any side effects, injection site reactions, or recurrent infections.  He has no evidence of Achilles tendinitis or planter fasciitis at this time.  No SI joint tenderness upon palpation.  No active psoriasis at this time.  He will remain on combination therapy as prescribed.  He does not need any refills at this time.  He was advised to notify us if he develops increased joint pain or joint swelling.  He will follow-up in the office in 5 months. ? ?Psoriasis: He has no active psoriasis at this time.  ? ?High risk medication use - Humira 40 mg sq injection every 14 days, Methotrexate 2.5 mg 8 tablets once weekly, and folic acid 1 mg 2 tablets by mouth daily. - Plan: COMPLETE METABOLIC PANEL WITH GFR, CBC with Differential/Platelet ?CBC and CMP updated on 03/04/21.  He is due to update lab work today.  Orders released.  His next lab work will be due in July and every 3 months.  ?TB gold negative on 03/04/21.  ?No recent infections. Discussed the importance of holding humira and  methotrexate if he develops signs or symptoms of an infection and to resume once the infection has completely cleared.  ?He had a recent dermatology appointment for yearly skin examination.  He was advised to follow-up on

## 2021-05-16 ENCOUNTER — Encounter: Payer: Self-pay | Admitting: Dermatology

## 2021-05-19 ENCOUNTER — Other Ambulatory Visit (HOSPITAL_COMMUNITY): Payer: Self-pay

## 2021-05-19 ENCOUNTER — Telehealth: Payer: Self-pay

## 2021-05-19 NOTE — Telephone Encounter (Signed)
Patient informed of pathology results 

## 2021-05-19 NOTE — Telephone Encounter (Signed)
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 ?Moderate dysplastic ?Recheck next visit ?

## 2021-05-23 ENCOUNTER — Other Ambulatory Visit (HOSPITAL_COMMUNITY): Payer: Self-pay

## 2021-05-26 ENCOUNTER — Other Ambulatory Visit: Payer: Self-pay | Admitting: Rheumatology

## 2021-05-26 NOTE — Telephone Encounter (Signed)
Next Visit: 05/29/2021 ?  ?Last Visit: 12/26/2020 ?  ?Last Fill: 05/02/2021 ?  ?Dx: Psoriatic arthritis  ?  ?Current Dose per office note on 12/26/2020: not discussed. ?  ?Okay to refill Flexeril?   ?

## 2021-05-29 ENCOUNTER — Encounter: Payer: Self-pay | Admitting: Physician Assistant

## 2021-05-29 ENCOUNTER — Ambulatory Visit (INDEPENDENT_AMBULATORY_CARE_PROVIDER_SITE_OTHER): Payer: Medicaid Other | Admitting: Physician Assistant

## 2021-05-29 VITALS — BP 133/91 | HR 92 | Ht 66.0 in | Wt 197.4 lb

## 2021-05-29 DIAGNOSIS — L4050 Arthropathic psoriasis, unspecified: Secondary | ICD-10-CM | POA: Diagnosis not present

## 2021-05-29 DIAGNOSIS — M19072 Primary osteoarthritis, left ankle and foot: Secondary | ICD-10-CM

## 2021-05-29 DIAGNOSIS — M19071 Primary osteoarthritis, right ankle and foot: Secondary | ICD-10-CM

## 2021-05-29 DIAGNOSIS — M533 Sacrococcygeal disorders, not elsewhere classified: Secondary | ICD-10-CM

## 2021-05-29 DIAGNOSIS — Z981 Arthrodesis status: Secondary | ICD-10-CM

## 2021-05-29 DIAGNOSIS — M19041 Primary osteoarthritis, right hand: Secondary | ICD-10-CM | POA: Diagnosis not present

## 2021-05-29 DIAGNOSIS — L409 Psoriasis, unspecified: Secondary | ICD-10-CM | POA: Diagnosis not present

## 2021-05-29 DIAGNOSIS — Z79899 Other long term (current) drug therapy: Secondary | ICD-10-CM | POA: Diagnosis not present

## 2021-05-29 DIAGNOSIS — G8929 Other chronic pain: Secondary | ICD-10-CM

## 2021-05-29 DIAGNOSIS — I10 Essential (primary) hypertension: Secondary | ICD-10-CM

## 2021-05-29 DIAGNOSIS — M19042 Primary osteoarthritis, left hand: Secondary | ICD-10-CM

## 2021-05-29 NOTE — Patient Instructions (Signed)
Standing Labs ?We placed an order today for your standing lab work.  ? ?Please have your standing labs drawn in July and every 3 months  ? ?If possible, please have your labs drawn 2 weeks prior to your appointment so that the provider can discuss your results at your appointment. ? ?Please note that you may see your imaging and lab results in MyChart before we have reviewed them. ?We may be awaiting multiple results to interpret others before contacting you. ?Please allow our office up to 72 hours to thoroughly review all of the results before contacting the office for clarification of your results. ? ?We have open lab daily: ?Monday through Thursday from 1:30-4:30 PM and Friday from 1:30-4:00 PM ?at the office of Dr. Shaili Deveshwar, [facility].   ?Please be advised, all patients with office appointments requiring lab work will take precedent over walk-in lab work.  ?If possible, please come for your lab work on Monday and Friday afternoons, as you may experience shorter wait times. ?The office is located at [address], [address], [place], [place] [address] ?No appointment is necessary.   ?Labs are drawn by [facility]. Please bring your co-pay at the time of your lab draw.  You may receive a bill from [facility] for your lab work. ? ?Please note if you are on Hydroxychloroquine and and an order has been placed for a Hydroxychloroquine level, you will need to have it drawn 4 hours or more after your last dose. ? ?If you wish to have your labs drawn at another location, please call the office 24 hours in advance to send orders. ? ?If you have any questions regarding directions or hours of operation,  ?please call [phone].   ?As a reminder, please drink plenty of water prior to coming for your lab work. Thanks! ? ?

## 2021-05-30 LAB — COMPLETE METABOLIC PANEL WITH GFR
AG Ratio: 1.3 (calc) (ref 1.0–2.5)
ALT: 32 U/L (ref 9–46)
AST: 18 U/L (ref 10–40)
Albumin: 4.3 g/dL (ref 3.6–5.1)
Alkaline phosphatase (APISO): 68 U/L (ref 36–130)
BUN: 8 mg/dL (ref 7–25)
CO2: 25 mmol/L (ref 20–32)
Calcium: 9.4 mg/dL (ref 8.6–10.3)
Chloride: 103 mmol/L (ref 98–110)
Creat: 0.7 mg/dL (ref 0.60–1.26)
Globulin: 3.2 g/dL (calc) (ref 1.9–3.7)
Glucose, Bld: 93 mg/dL (ref 65–99)
Potassium: 4.1 mmol/L (ref 3.5–5.3)
Sodium: 137 mmol/L (ref 135–146)
Total Bilirubin: 0.6 mg/dL (ref 0.2–1.2)
Total Protein: 7.5 g/dL (ref 6.1–8.1)
eGFR: 123 mL/min/{1.73_m2} (ref >=60)

## 2021-05-30 LAB — CBC WITH DIFFERENTIAL/PLATELET
Absolute Monocytes: 1131 cells/uL — ABNORMAL HIGH (ref 200–950)
Basophils Absolute: 71 cells/uL (ref 0–200)
Basophils Relative: 0.7 %
Eosinophils Absolute: 162 cells/uL (ref 15–500)
Eosinophils Relative: 1.6 %
HCT: 41.5 % (ref 38.5–50.0)
Hemoglobin: 14.1 g/dL (ref 13.2–17.1)
Lymphs Abs: 2969 cells/uL (ref 850–3900)
MCH: 32 pg (ref 27.0–33.0)
MCHC: 34 g/dL (ref 32.0–36.0)
MCV: 94.3 fL (ref 80.0–100.0)
MPV: 9.8 fL (ref 7.5–12.5)
Monocytes Relative: 11.2 %
Neutro Abs: 5767 cells/uL (ref 1500–7800)
Neutrophils Relative %: 57.1 %
Platelets: 290 10*3/uL (ref 140–400)
RBC: 4.4 10*6/uL (ref 4.20–5.80)
RDW: 12.8 % (ref 11.0–15.0)
Total Lymphocyte: 29.4 %
WBC: 10.1 10*3/uL (ref 3.8–10.8)

## 2021-05-30 NOTE — Progress Notes (Signed)
CMP WNL. Absolute monocytes are borderline elevated but stable. Rest of CBC WNL.  We will continue to monitor.

## 2021-06-05 ENCOUNTER — Other Ambulatory Visit: Payer: Self-pay | Admitting: Physician Assistant

## 2021-06-05 ENCOUNTER — Other Ambulatory Visit (HOSPITAL_COMMUNITY): Payer: Self-pay

## 2021-06-05 DIAGNOSIS — L4050 Arthropathic psoriasis, unspecified: Secondary | ICD-10-CM

## 2021-06-05 MED ORDER — HUMIRA (2 PEN) 40 MG/0.4ML ~~LOC~~ AJKT
AUTO-INJECTOR | SUBCUTANEOUS | 0 refills | Status: DC
  Filled 2021-06-12: qty 2, 28d supply, fill #0
  Filled 2021-07-08: qty 2, 28d supply, fill #1
  Filled 2021-08-01: qty 2, 28d supply, fill #2

## 2021-06-05 NOTE — Telephone Encounter (Signed)
Next Visit: 11/04/2021 ? ?Last Visit: 05/29/2021 ? ?Last Fill: 03/03/2021 ? ?JE:[id number] arthritis  ? ?Current Dose per office note 05/29/2021: Humira 40 mg sq injection every 14 days ? ?Labs: 05/29/2021 CMP WNL. Absolute monocytes are borderline elevated but stable. Rest of CBC WNL.  ? ?TB Gold: 03/04/2021  ? ?Okay to refill Humira?  ?

## 2021-06-06 ENCOUNTER — Other Ambulatory Visit (HOSPITAL_COMMUNITY): Payer: Self-pay

## 2021-06-12 ENCOUNTER — Other Ambulatory Visit (HOSPITAL_COMMUNITY): Payer: Self-pay

## 2021-06-18 ENCOUNTER — Other Ambulatory Visit (HOSPITAL_COMMUNITY): Payer: Self-pay

## 2021-06-21 ENCOUNTER — Other Ambulatory Visit: Payer: Self-pay | Admitting: Rheumatology

## 2021-06-23 NOTE — Telephone Encounter (Signed)
Next Visit: 11/04/2021 ?  ?Last Visit: 05/29/2021 ?  ?Last Fill: 05/26/2021 ? ?DX: History of thoracic spinal fusion  ?  ?Current Dose per office note 05/29/2021: Flexeril 10 mg at bedtime for muscle spasms ? ?Okay to refill Flexeril?  ?

## 2021-07-08 ENCOUNTER — Other Ambulatory Visit (HOSPITAL_COMMUNITY): Payer: Self-pay

## 2021-07-10 ENCOUNTER — Other Ambulatory Visit (HOSPITAL_COMMUNITY): Payer: Self-pay

## 2021-08-01 ENCOUNTER — Other Ambulatory Visit (HOSPITAL_COMMUNITY): Payer: Self-pay

## 2021-08-04 ENCOUNTER — Other Ambulatory Visit: Payer: Self-pay | Admitting: Rheumatology

## 2021-08-08 ENCOUNTER — Other Ambulatory Visit (HOSPITAL_COMMUNITY): Payer: Self-pay

## 2021-08-21 ENCOUNTER — Other Ambulatory Visit: Payer: Self-pay | Admitting: Physician Assistant

## 2021-08-21 NOTE — Telephone Encounter (Signed)
Next Visit: 11/04/2021   Last Visit: 05/29/2021   Last Fill: 05/12/2021  OJ:[id number] arthritis    Current Dose per office note 05/29/2021: Methotrexate 2.5 mg 8 tablets once weekly  Labs: 05/29/2021 CMP WNL. Absolute monocytes are borderline elevated but stable. Rest of CBC WNL.   Okay to refill MTX?

## 2021-09-01 ENCOUNTER — Telehealth: Payer: Self-pay | Admitting: Pharmacy Technician

## 2021-09-01 ENCOUNTER — Other Ambulatory Visit (HOSPITAL_COMMUNITY): Payer: Self-pay

## 2021-09-01 NOTE — Telephone Encounter (Addendum)
Patient Advocate Encounter  Prior Authorization for HUMIRA PEN '40MG'$ /0.4ML has been approved.    PA# [id number] Effective dates: 07.24.23 through 7.24.24  Keonda Dow B. CPhT P: [phone] F: [phone]   Submitted a Prior Authorization request to [facility] for HUMIRA via CoverMyMeds. Will update once we receive a response.  Key: [id number]

## 2021-09-02 ENCOUNTER — Other Ambulatory Visit: Payer: Self-pay | Admitting: Physician Assistant

## 2021-09-02 ENCOUNTER — Other Ambulatory Visit (HOSPITAL_COMMUNITY): Payer: Self-pay

## 2021-09-02 DIAGNOSIS — L4050 Arthropathic psoriasis, unspecified: Secondary | ICD-10-CM

## 2021-09-02 MED ORDER — HUMIRA (2 PEN) 40 MG/0.4ML ~~LOC~~ AJKT
AUTO-INJECTOR | SUBCUTANEOUS | 0 refills | Status: DC
  Filled 2021-09-02 (×2): qty 2, 28d supply, fill #0
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
## 2021-09-04 ENCOUNTER — Other Ambulatory Visit (HOSPITAL_COMMUNITY): Payer: Self-pay

## 2021-09-04 ENCOUNTER — Other Ambulatory Visit: Payer: Self-pay | Admitting: Physician Assistant

## 2021-09-04 NOTE — Telephone Encounter (Signed)
Next Visit: 11/04/2021   Last Visit: 05/29/2021   Last Fill: 09/09/2020  IF:[id number] arthritis    Current Dose per office note 1/47/0929:  folic acid 1 mg 2 tablets by mouth daily  Okay to refill Folic Acid?

## 2021-09-05 ENCOUNTER — Other Ambulatory Visit: Payer: Self-pay | Admitting: *Deleted

## 2021-09-05 DIAGNOSIS — Z79899 Other long term (current) drug therapy: Secondary | ICD-10-CM

## 2021-09-06 LAB — CBC WITH DIFFERENTIAL/PLATELET
Absolute Monocytes: 1071 cells/uL — ABNORMAL HIGH (ref 200–950)
Basophils Absolute: 71 cells/uL (ref 0–200)
Basophils Relative: 0.6 %
Eosinophils Absolute: 202 cells/uL (ref 15–500)
Eosinophils Relative: 1.7 %
HCT: 41.3 % (ref 38.5–50.0)
Hemoglobin: 14 g/dL (ref 13.2–17.1)
Lymphs Abs: 3975 cells/uL — ABNORMAL HIGH (ref 850–3900)
MCH: 32.3 pg (ref 27.0–33.0)
MCHC: 33.9 g/dL (ref 32.0–36.0)
MCV: 95.4 fL (ref 80.0–100.0)
MPV: 9.7 fL (ref 7.5–12.5)
Monocytes Relative: 9 %
Neutro Abs: 6581 cells/uL (ref 1500–7800)
Neutrophils Relative %: 55.3 %
Platelets: 317 10*3/uL (ref 140–400)
RBC: 4.33 10*6/uL (ref 4.20–5.80)
RDW: 12.8 % (ref 11.0–15.0)
Total Lymphocyte: 33.4 %
WBC: 11.9 10*3/uL — ABNORMAL HIGH (ref 3.8–10.8)

## 2021-09-06 LAB — COMPLETE METABOLIC PANEL WITH GFR
AG Ratio: 1.6 (calc) (ref 1.0–2.5)
ALT: 50 U/L — ABNORMAL HIGH (ref 9–46)
AST: 19 U/L (ref 10–40)
Albumin: 4.5 g/dL (ref 3.6–5.1)
Alkaline phosphatase (APISO): 87 U/L (ref 36–130)
BUN: 11 mg/dL (ref 7–25)
CO2: 29 mmol/L (ref 20–32)
Calcium: 9.8 mg/dL (ref 8.6–10.3)
Chloride: 101 mmol/L (ref 98–110)
Creat: 0.95 mg/dL (ref 0.60–1.26)
Globulin: 2.8 g/dL (calc) (ref 1.9–3.7)
Glucose, Bld: 86 mg/dL (ref 65–99)
Potassium: 4.4 mmol/L (ref 3.5–5.3)
Sodium: 138 mmol/L (ref 135–146)
Total Bilirubin: 0.7 mg/dL (ref 0.2–1.2)
Total Protein: 7.3 g/dL (ref 6.1–8.1)
eGFR: 106 mL/min/{1.73_m2} (ref >=60)

## 2021-09-08 ENCOUNTER — Other Ambulatory Visit (HOSPITAL_COMMUNITY): Payer: Self-pay

## 2021-09-08 NOTE — Progress Notes (Signed)
CBC stable. ALT is borderline elevated. AST WNL.  Rest of CMP WNL.  Please encourage the patient to try to avoid tylenol and alcohol use.

## 2021-09-10 ENCOUNTER — Other Ambulatory Visit: Payer: Self-pay | Admitting: Rheumatology

## 2021-09-10 MED ORDER — CYCLOBENZAPRINE HCL 10 MG PO TABS: 10.0000 mg | ORAL_TABLET | Freq: Every day | ORAL | 0 refills | Status: DC

## 2021-09-10 NOTE — Telephone Encounter (Signed)
Next Visit: 11/04/2021   Last Visit: 05/29/2021   Last Fill: 08/04/2021   DX: History of thoracic spinal fusion    Current Dose per office note 05/29/2021: Flexeril 10 mg at bedtime for muscle spasms   Okay to refill Flexeril?

## 2021-09-10 NOTE — Telephone Encounter (Signed)
Patient called requesting prescription refill of Cyclobenzaprine to be sent to [facility] at [address].

## 2021-09-12 ENCOUNTER — Other Ambulatory Visit (HOSPITAL_COMMUNITY): Payer: Self-pay

## 2021-09-30 ENCOUNTER — Other Ambulatory Visit (HOSPITAL_COMMUNITY): Payer: Self-pay

## 2021-09-30 ENCOUNTER — Other Ambulatory Visit: Payer: Self-pay | Admitting: Physician Assistant

## 2021-09-30 DIAGNOSIS — L4050 Arthropathic psoriasis, unspecified: Secondary | ICD-10-CM

## 2021-09-30 MED ORDER — HUMIRA (2 PEN) 40 MG/0.4ML ~~LOC~~ AJKT
AUTO-INJECTOR | SUBCUTANEOUS | 2 refills | Status: DC
  Filled 2021-09-30: qty 2, 28d supply, fill #0
  Filled 2021-10-28: qty 2, 28d supply, fill #1
  Filled 2021-11-25: qty 2, 28d supply, fill #2

## 2021-09-30 NOTE — Telephone Encounter (Signed)
Next Visit: 11/04/2021  Last Visit: 05/29/2021  Last Fill: 09/02/2021 (30 day supply)  PE:[id number] arthritis   Current Dose per office note 05/29/2020: Humira 40 mg sq injection every 14 days  Labs: 09/05/2021 CBC stable. ALT is borderline elevated. AST WNL.  Rest of CMP WNL.   TB Gold: 03/04/2021 Neg    Okay to refill Humira?

## 2021-10-02 ENCOUNTER — Other Ambulatory Visit (HOSPITAL_COMMUNITY): Payer: Self-pay

## 2021-10-17 ENCOUNTER — Other Ambulatory Visit: Payer: Self-pay | Admitting: Physician Assistant

## 2021-10-17 NOTE — Telephone Encounter (Signed)
Next Visit: 11/04/2021  Last Visit: 05/29/2021  Last Fill: 09/10/2021  Dx: History of thoracic spinal fusion   Current Dose per office note on 05/29/2021: Flexeril 10 mg at bedtime for muscle spasms  Okay to refill Flexeril?

## 2021-10-21 NOTE — Progress Notes (Signed)
Office Visit Note  Patient: Joshua English             Date of Birth: 1985-02-14           MRN: [id number]             PCP: Ferd Hibbs, NP Referring: Ferd Hibbs, NP Visit Date: 11/04/2021 Occupation: '@GUAROCC'$ @  Subjective:  Medication management  History of Present Illness: Joshua English is a 36 y.o. male with history of psoriatic arthritis, psoriasis and degenerative disc disease.  He states he has been taking methotrexate 8 tablets weekly with 2 mg of folic acid and Humira 40 mg subcu every other week on a regular basis.  He denies any increased joint swelling.  He continues to have some stiffness in his joints especially in his back.  He denies any psoriasis lesions.  He states he was seen by a dermatologist and had a mole removed in March 2023.  He has been using sunscreen.  He continues to have chronic thoracic and lower back pain due to underlying scoliosis.  He denies any history of sacroiliitis, Achilles tendinitis, Planter fasciitis or uveitis.  Activities of Daily Living:  Patient reports morning stiffness for 1 hour.   Patient Reports nocturnal pain.  Difficulty dressing/grooming: Denies Difficulty climbing stairs: Denies Difficulty getting out of chair: Denies Difficulty using hands for taps, buttons, cutlery, and/or writing: Denies  Review of Systems  Constitutional:  Positive for fatigue.  HENT:  Negative for mouth sores and mouth dryness.   Eyes:  Negative for dryness.  Respiratory:  Negative for shortness of breath.   Cardiovascular:  Negative for chest pain and palpitations.  Gastrointestinal:  Negative for blood in stool, constipation and diarrhea.  Endocrine: Negative for increased urination.  Genitourinary:  Negative for involuntary urination.  Musculoskeletal:  Positive for joint pain, joint pain, joint swelling and morning stiffness. Negative for gait problem, myalgias, muscle weakness, muscle tenderness and myalgias.  Skin:  Negative for color  change, rash, hair loss and sensitivity to sunlight.  Allergic/Immunologic: Negative for susceptible to infections.  Neurological:  Negative for dizziness and headaches.  Hematological:  Negative for swollen glands.  Psychiatric/Behavioral:  Positive for sleep disturbance. Negative for depressed mood. The patient is not nervous/anxious.     PMFS History:  Patient Active Problem List   Diagnosis Date Noted   Psoriatic arthritis ([place]) 02/21/2019   Other psoriasis 02/21/2019   High risk medication use 02/21/2019    Past Medical History:  Diagnosis Date   Dysplastic nevus 05/14/2021   R upper back - moderate   Psoriatic arthritis ([place])    Scliosis    arthritis -Psoriatic   Scoliosis     Family History  Problem Relation Age of Onset   Heart attack Father    Diabetes Maternal Grandmother    Hypertension Maternal Grandmother    Kidney disease Maternal Grandmother    Past Surgical History:  Procedure Laterality Date   BACK SURGERY  1994   for scoliosis   INSERTION OF MESH N/A 11/09/2016   Procedure: INSERTION OF MESH;  Surgeon: Jovita Kussmaul, MD;  Location: [facility];  Service: General;  Laterality: N/A;   KNEE SURGERY Left    menicus repair    MOLE REMOVAL  04/2021   TONSILLECTOMY     UMBILICAL HERNIA REPAIR N/A 11/09/2016   Procedure: HERNIA REPAIR UMBILICAL ADULT;  Surgeon: Jovita Kussmaul, MD;  Location: [facility];  Service: General;  Laterality: N/A;   Social History  Social History Narrative   Not on file   Immunization History  Administered Date(s) Administered   Influenza-Unspecified 11/09/2017   PFIZER(Purple Top)SARS-COV-2 Vaccination 03/13/2019, 04/17/2019   Tdap 10/03/2015     Objective: Vital Signs: BP (!) 153/106 (BP Location: Left Arm, Patient Position: Sitting, Cuff Size: Normal)   Pulse 86   Resp 18   Ht '5\' 6"'$  (1.676 m)   Wt 194 lb 6.4 oz (88.2 kg)   BMI 31.38 kg/m    Physical Exam Vitals and nursing note reviewed.  Constitutional:      Appearance:  He is well-developed.  HENT:     Head: Normocephalic and atraumatic.  Eyes:     Conjunctiva/sclera: Conjunctivae normal.     Pupils: Pupils are equal, round, and reactive to light.  Cardiovascular:     Rate and Rhythm: Normal rate and regular rhythm.     Heart sounds: Normal heart sounds.  Pulmonary:     Effort: Pulmonary effort is normal.     Breath sounds: Normal breath sounds.  Abdominal:     General: Bowel sounds are normal.     Palpations: Abdomen is soft.  Musculoskeletal:     Cervical back: Normal range of motion and neck supple.  Skin:    General: Skin is warm and dry.     Capillary Refill: Capillary refill takes less than 2 seconds.  Neurological:     Mental Status: He is alert and oriented to person, place, and time.  Psychiatric:        Behavior: Behavior normal.      Musculoskeletal Exam: C-spine was in good range of motion.  He has severe thoracic and lumbar scoliosis with a spinal fusion.  Shoulder joints, elbow joints, wrist joints, MCPs PIPs and DIPs with good range of motion with no synovitis.  Hip joints and knee joints were in good range of motion.  He had no tenderness or callus region.  No tenderness over ankles or MTPs.  CDAI Exam: CDAI Score: -- Patient Global: --; Provider Global: -- Swollen: --; Tender: -- Joint Exam 11/04/2021   No joint exam has been documented for this visit   There is currently no information documented on the homunculus. Go to the Rheumatology activity and complete the homunculus joint exam.  Investigation: No additional findings.  Imaging: No results found.  Recent Labs: Lab Results  Component Value Date   WBC 11.9 (H) 09/05/2021   HGB 14.0 09/05/2021   PLT 317 09/05/2021   NA 138 09/05/2021   K 4.4 09/05/2021   CL 101 09/05/2021   CO2 29 09/05/2021   GLUCOSE 86 09/05/2021   BUN 11 09/05/2021   CREATININE 0.95 09/05/2021   BILITOT 0.7 09/05/2021   AST 19 09/05/2021   ALT 50 (H) 09/05/2021   PROT 7.3  09/05/2021   CALCIUM 9.8 09/05/2021   GFRAA 137 07/25/2020   QFTBGOLDPLUS NEGATIVE 03/04/2021    Speciality Comments: Treatment by Dr. Dionisio Paschal response, Remicade-insurance issues Humira start-02/21  Procedures:  No procedures performed Allergies: Patient has no known allergies.   Assessment / Plan:     Visit Diagnoses: Psoriatic arthritis ([place]) - Diagnosed while he was in college.  Previous patient of Dr. Dossie Der.  (Enbrel -inadequate response, Remicade discontinued due to insurance issues): He has been doing well on the combination of methotrexate and Humira.  He has been taking methotrexate and Humira on a regular basis without any interruption since the last visit.  He has joint to stiffness but no joint  swelling.  He experiences some discomfort in his hands at times.  He denies any history of sacroiliitis, Planter fasciitis, Achilles tendinitis or uveitis.  Psoriasis-denies any recent psoriasis lesions.  High risk medication use - Humira 40 mg sq injection every 14 days, Methotrexate 2.5 mg 8 tablets once weekly, and folic acid 1 mg 2 tablets by mouth daily.  Labs obtained on September 05, 2021 were reviewed.  White cell count was elevated at 11.9.  ALT was elevated at 50.  We will recheck labs in October.  He is advised to get labs every 3 months.  TB gold was negative on March 04, 2021.  We will continue to check TB Gold on an annual basis.  Information regarding immunization was placed in the AVS.  He was advised to hold methotrexate and Humira if he develops an infection and resume after the infection resolves.  He has been getting annual skin examination to screen for skin cancer.  A mole was removed in March 2023.  Use of sunscreen was discussed.  Primary osteoarthritis of both hands-he complains of some stiffness in his hands.  No synovitis was noted.  Primary osteoarthritis of both feet-he had no tenderness over ankles or MTPs.  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  He denies any discomfort in the SI joint area.  History of thoracic spinal fusion - Congenital scoliosis, surgery at age of 88 he has chronic discomfort in his thoracic and lumbar region.  He is on Flexeril 10 mg p.o. nightly.  Essential hypertension-his blood pressure was elevated today.  He states he took blood pressure medication just before the appointment.  He was advised to monitor blood pressure closely and follow-up with his PCP.  Orders: No orders of the defined types were placed in this encounter.  No orders of the defined types were placed in this encounter.    Follow-Up Instructions: Return in about 5 months (around 04/06/2022) for Psoriatic arthritis.   Bo Merino, MD  Note - This record has been created using Editor, commissioning.  Chart creation errors have been sought, but may not always  have been located. Such creation errors do not reflect on  the standard of medical care.

## 2021-10-28 ENCOUNTER — Other Ambulatory Visit (HOSPITAL_COMMUNITY): Payer: Self-pay

## 2021-11-04 ENCOUNTER — Other Ambulatory Visit (HOSPITAL_COMMUNITY): Payer: Self-pay

## 2021-11-04 ENCOUNTER — Encounter: Payer: Self-pay | Admitting: Rheumatology

## 2021-11-04 ENCOUNTER — Ambulatory Visit: Payer: Medicaid Other | Attending: Rheumatology | Admitting: Rheumatology

## 2021-11-04 VITALS — BP 153/106 | HR 86 | Resp 18 | Ht 66.0 in | Wt 194.4 lb

## 2021-11-04 DIAGNOSIS — Z981 Arthrodesis status: Secondary | ICD-10-CM

## 2021-11-04 DIAGNOSIS — L4050 Arthropathic psoriasis, unspecified: Secondary | ICD-10-CM | POA: Diagnosis not present

## 2021-11-04 DIAGNOSIS — M19041 Primary osteoarthritis, right hand: Secondary | ICD-10-CM

## 2021-11-04 DIAGNOSIS — M19042 Primary osteoarthritis, left hand: Secondary | ICD-10-CM

## 2021-11-04 DIAGNOSIS — L409 Psoriasis, unspecified: Secondary | ICD-10-CM

## 2021-11-04 DIAGNOSIS — Z79899 Other long term (current) drug therapy: Secondary | ICD-10-CM

## 2021-11-04 DIAGNOSIS — M19072 Primary osteoarthritis, left ankle and foot: Secondary | ICD-10-CM

## 2021-11-04 DIAGNOSIS — M19071 Primary osteoarthritis, right ankle and foot: Secondary | ICD-10-CM

## 2021-11-04 DIAGNOSIS — I10 Essential (primary) hypertension: Secondary | ICD-10-CM

## 2021-11-04 DIAGNOSIS — M533 Sacrococcygeal disorders, not elsewhere classified: Secondary | ICD-10-CM

## 2021-11-04 DIAGNOSIS — G8929 Other chronic pain: Secondary | ICD-10-CM

## 2021-11-04 NOTE — Patient Instructions (Signed)
Standing Labs We placed an order today for your standing lab work.   Please have your standing labs drawn in October and every 3 months  Please have your labs drawn 2 weeks prior to your appointment so that the provider can discuss your lab results at your appointment.  Please note that you may see your imaging and lab results in [place] before we have reviewed them. We will contact you once all results are reviewed. Please allow our office up to 72 hours to thoroughly review all of the results before contacting the office for clarification of your results.  Lab hours are: Monday through Thursday from 1:30 pm-4:30 pm and Friday from 1:30 pm- 4:00 pm  You may experience shorter wait times on Monday, Thursday or Friday afternoons,.   Effective December 08, 2021, new lab hours will be: Monday through Thursday from 8:00 am -12:30 pm and 1:00 pm-5:00 pm and Friday from 8:00 am-12:00 pm.  Please be advised, all patients with office appointments requiring lab work will take precedent over walk-in lab work.   Labs are drawn by [facility]. Please bring your co-pay at the time of your lab draw.  You may receive a bill from [facility] for your lab work.  Please note if you are on Hydroxychloroquine and and an order has been placed for a Hydroxychloroquine level, you will need to have it drawn 4 hours or more after your last dose.  If you wish to have your labs drawn at another location, please call the office 24 hours in advance so we can fax the orders.  The office is located at [address], [place], [place], [place] [address] No appointment is necessary.    If you have any questions regarding directions or hours of operation,  please call [phone].   As a reminder, please drink plenty of water prior to coming for your lab work. Thanks!   Vaccines You are taking a medication(s) that can suppress your immune system.  The following immunizations are recommended: Flu annually Covid-19  Td/Tdap  (tetanus, diphtheria, pertussis) every 10 years Pneumonia (Prevnar 15 then Pneumovax 23 at least 1 year apart.  Alternatively, can take Prevnar 20 without needing additional dose) Shingrix: 2 doses from 4 weeks to 6 months apart  Please check with your PCP to make sure you are up to date.   If you have signs or symptoms of an infection or start antibiotics: First, call your PCP for workup of your infection. Hold your medication through the infection, until you complete your antibiotics, and until symptoms resolve if you take the following: Injectable medication (Actemra, Benlysta, Cimzia, Cosentyx, Enbrel, Humira, Kevzara, Orencia, Remicade, Simponi, Stelara, Taltz, Tremfya) Methotrexate Leflunomide (Arava) Mycophenolate (Cellcept) Morrie Sheldon, Olumiant, or Rinvoq  Please get an annual skin exam while you are on Humira to screen for skin cancer

## 2021-11-10 ENCOUNTER — Other Ambulatory Visit: Payer: Self-pay | Admitting: Physician Assistant

## 2021-11-11 NOTE — Telephone Encounter (Signed)
Patient advised to decrease dose of MTX to 7 tabs po once weekly due to elevated ALT. Patient expressed understanding.

## 2021-11-11 NOTE — Telephone Encounter (Addendum)
Please ask patient to reduce  the dose of methotrexate to 7 tablets/week due to elevated ALT.

## 2021-11-11 NOTE — Telephone Encounter (Signed)
Next Visit: 04/06/2022  Last Visit: 11/04/2021  Last Fill: 08/21/2021  DX: Psoriatic arthritis   Current Dose per office note 11/04/2021: Methotrexate 2.5 mg 8 tablets once weekly  Labs: 09/05/2021 CBC stable. ALT is borderline elevated. AST WNL.   Okay to refill MTX?

## 2021-11-13 ENCOUNTER — Other Ambulatory Visit (HOSPITAL_COMMUNITY): Payer: Self-pay

## 2021-11-14 ENCOUNTER — Other Ambulatory Visit: Payer: Self-pay | Admitting: Rheumatology

## 2021-11-14 MED ORDER — CYCLOBENZAPRINE HCL 10 MG PO TABS
10.0000 mg | ORAL_TABLET | Freq: Every day | ORAL | 0 refills | Status: DC
Start: 2021-11-14 — End: 2021-12-15

## 2021-11-14 NOTE — Telephone Encounter (Signed)
Patient called the office requesting a refill of Flexeril '10mg'$  be sent to [facility] on [address].

## 2021-11-14 NOTE — Telephone Encounter (Signed)
Next Visit: 2/262024  Last Visit: 11/04/2021  Last Fill: 10/17/2021  Dx: History of thoracic spinal fusion   Current Dose per office note on 11/04/2021: Flexeril 10 mg p.o. nightly.  Okay to refill Flexeril?

## 2021-11-25 ENCOUNTER — Other Ambulatory Visit (HOSPITAL_COMMUNITY): Payer: Self-pay

## 2021-11-27 ENCOUNTER — Other Ambulatory Visit (HOSPITAL_COMMUNITY): Payer: Self-pay

## 2021-12-09 ENCOUNTER — Other Ambulatory Visit: Payer: Self-pay | Admitting: *Deleted

## 2021-12-09 DIAGNOSIS — Z111 Encounter for screening for respiratory tuberculosis: Secondary | ICD-10-CM

## 2021-12-09 DIAGNOSIS — Z79899 Other long term (current) drug therapy: Secondary | ICD-10-CM

## 2021-12-09 DIAGNOSIS — Z9225 Personal history of immunosupression therapy: Secondary | ICD-10-CM

## 2021-12-09 LAB — CBC WITH DIFFERENTIAL/PLATELET
Absolute Monocytes: 864 cells/uL (ref 200–950)
Basophils Absolute: 58 cells/uL (ref 0–200)
Basophils Relative: 0.6 %
Eosinophils Absolute: 221 cells/uL (ref 15–500)
Eosinophils Relative: 2.3 %
HCT: 41.9 % (ref 38.5–50.0)
Hemoglobin: 14.7 g/dL (ref 13.2–17.1)
Lymphs Abs: 3283 cells/uL (ref 850–3900)
MCH: 32.3 pg (ref 27.0–33.0)
MCHC: 35.1 g/dL (ref 32.0–36.0)
MCV: 92.1 fL (ref 80.0–100.0)
MPV: 9.8 fL (ref 7.5–12.5)
Monocytes Relative: 9 %
Neutro Abs: 5174 cells/uL (ref 1500–7800)
Neutrophils Relative %: 53.9 %
Platelets: 314 10*3/uL (ref 140–400)
RBC: 4.55 10*6/uL (ref 4.20–5.80)
RDW: 12 % (ref 11.0–15.0)
Total Lymphocyte: 34.2 %
WBC: 9.6 10*3/uL (ref 3.8–10.8)

## 2021-12-15 ENCOUNTER — Other Ambulatory Visit: Payer: Self-pay | Admitting: Physician Assistant

## 2021-12-15 NOTE — Telephone Encounter (Signed)
Next Visit: 2/262024   Last Visit: 11/04/2021   Last Fill: 11/14/2021   Dx: History of thoracic spinal fusion    Current Dose per office note on 11/04/2021: Flexeril 10 mg p.o. nightly.   Okay to refill Flexeril?

## 2021-12-18 ENCOUNTER — Other Ambulatory Visit: Payer: Self-pay | Admitting: Physician Assistant

## 2021-12-18 ENCOUNTER — Other Ambulatory Visit (HOSPITAL_COMMUNITY): Payer: Self-pay

## 2021-12-18 ENCOUNTER — Other Ambulatory Visit: Payer: Self-pay | Admitting: *Deleted

## 2021-12-18 DIAGNOSIS — L4050 Arthropathic psoriasis, unspecified: Secondary | ICD-10-CM

## 2021-12-18 DIAGNOSIS — Z79899 Other long term (current) drug therapy: Secondary | ICD-10-CM

## 2021-12-18 LAB — COMPLETE METABOLIC PANEL WITH GFR
AG Ratio: 1.5 (calc) (ref 1.0–2.5)
ALT: 24 U/L (ref 9–46)
AST: 16 U/L (ref 10–40)
Albumin: 4.4 g/dL (ref 3.6–5.1)
Alkaline phosphatase (APISO): 84 U/L (ref 36–130)
BUN: 10 mg/dL (ref 7–25)
CO2: 27 mmol/L (ref 20–32)
Calcium: 9.4 mg/dL (ref 8.6–10.3)
Chloride: 103 mmol/L (ref 98–110)
Creat: 0.76 mg/dL (ref 0.60–1.26)
Globulin: 3 g/dL (calc) (ref 1.9–3.7)
Glucose, Bld: 92 mg/dL (ref 65–99)
Potassium: 4.2 mmol/L (ref 3.5–5.3)
Sodium: 137 mmol/L (ref 135–146)
Total Bilirubin: 0.5 mg/dL (ref 0.2–1.2)
Total Protein: 7.4 g/dL (ref 6.1–8.1)
eGFR: 119 mL/min/{1.73_m2} (ref >=60)

## 2021-12-18 MED ORDER — HUMIRA (2 PEN) 40 MG/0.4ML ~~LOC~~ AJKT
AUTO-INJECTOR | SUBCUTANEOUS | 2 refills | Status: DC
  Filled 2021-12-18: qty 2, 28d supply, fill #0
  Filled 2022-01-20 – 2022-01-21 (×2): qty 2, 28d supply, fill #1
  Filled 2022-02-17: qty 2, 28d supply, fill #2

## 2021-12-18 NOTE — Telephone Encounter (Signed)
Next Visit: 04/06/2022  Last Visit: 11/04/2021  Last Fill: 09/30/2021  DE:YCXKGYJEH arthritis   Current Dose per office note 11/04/2021: Humira 40 mg sq injection every 14 days   Labs: 12/09/2021 CBC WNL  TB Gold: 03/04/2021 WNL   Spoke with patient advised we need him to update his CMP. Patient states he will come this afternoon to update.  Okay to refill Humira?

## 2021-12-19 NOTE — Progress Notes (Signed)
CMP is normal.

## 2021-12-25 ENCOUNTER — Other Ambulatory Visit (HOSPITAL_COMMUNITY): Payer: Self-pay

## 2022-01-09 ENCOUNTER — Other Ambulatory Visit: Payer: Self-pay | Admitting: Rheumatology

## 2022-01-09 NOTE — Telephone Encounter (Signed)
Next Visit: 04/06/2022   Last Visit: 11/04/2021   Last Fill: 12/15/2021   Dx: History of thoracic spinal fusion    Current Dose per office note on 11/04/2021: Flexeril 10 mg p.o. nightly.   Okay to refill Flexeril?

## 2022-01-20 ENCOUNTER — Other Ambulatory Visit (HOSPITAL_COMMUNITY): Payer: Self-pay

## 2022-01-21 ENCOUNTER — Other Ambulatory Visit: Payer: Self-pay

## 2022-01-21 ENCOUNTER — Other Ambulatory Visit (HOSPITAL_COMMUNITY): Payer: Self-pay

## 2022-01-23 ENCOUNTER — Other Ambulatory Visit: Payer: Self-pay

## 2022-01-26 ENCOUNTER — Other Ambulatory Visit: Payer: Self-pay

## 2022-02-01 ENCOUNTER — Other Ambulatory Visit: Payer: Self-pay | Admitting: Rheumatology

## 2022-02-04 NOTE — Telephone Encounter (Signed)
Next Visit: 04/06/2022  Last Visit: 11/04/2021  Last Fill: 11/11/2021  DX: Psoriatic arthritis   Current Dose per office note 11/04/2021: 11/10/2021 labs note  Patient advised to decrease dose of MTX to 7 tabs po once weekly due to elevated ALT. Patient expressed understanding.    Labs: 12/18/2021  CMP is normal. 12/09/2021 CBC normal  Okay to refill MTX?

## 2022-02-17 ENCOUNTER — Other Ambulatory Visit (HOSPITAL_COMMUNITY): Payer: Self-pay

## 2022-02-18 ENCOUNTER — Other Ambulatory Visit: Payer: Self-pay

## 2022-02-19 ENCOUNTER — Other Ambulatory Visit: Payer: Self-pay | Admitting: Rheumatology

## 2022-02-19 NOTE — Telephone Encounter (Signed)
Next Visit: 04/06/2022   Last Visit: 11/04/2021   Last Fill: 01/09/2022   Dx: History of thoracic spinal fusion    Current Dose per office note on 11/04/2021: Flexeril 10 mg p.o. nightly.   Okay to refill Flexeril?

## 2022-02-24 ENCOUNTER — Other Ambulatory Visit (HOSPITAL_COMMUNITY): Payer: Self-pay

## 2022-03-12 ENCOUNTER — Other Ambulatory Visit: Payer: Self-pay | Admitting: Rheumatology

## 2022-03-12 ENCOUNTER — Other Ambulatory Visit (HOSPITAL_COMMUNITY): Payer: Self-pay

## 2022-03-12 DIAGNOSIS — L4050 Arthropathic psoriasis, unspecified: Secondary | ICD-10-CM

## 2022-03-12 MED ORDER — HUMIRA (2 PEN) 40 MG/0.4ML ~~LOC~~ AJKT
AUTO-INJECTOR | SUBCUTANEOUS | 0 refills | Status: DC
  Filled 2022-03-12: qty 2, 28d supply, fill #0

## 2022-03-12 NOTE — Telephone Encounter (Signed)
Next Visit: 04/06/2022   Last Visit: 11/04/2021   Last Fill: 12/18/2021  DX: Psoriatic arthritis    Current Dose per office note 11/04/2021: Humira 40 mg sq injection every 14 days   Labs: 12/18/2021  CMP is normal. 12/09/2021 CBC normal  TB Gold: 03/04/2021 Neg    Okay to refill Humira?

## 2022-03-13 ENCOUNTER — Other Ambulatory Visit: Payer: Self-pay | Admitting: Physician Assistant

## 2022-03-13 NOTE — Telephone Encounter (Signed)
Next Visit: 04/06/2022   Last Visit: 11/04/2021   Last Fill: 02/19/2022   Dx: History of thoracic spinal fusion    Current Dose per office note on 11/04/2021: Flexeril 10 mg p.o. nightly.   Okay to refill Flexeril?

## 2022-03-16 ENCOUNTER — Other Ambulatory Visit (HOSPITAL_COMMUNITY): Payer: Self-pay

## 2022-03-16 ENCOUNTER — Other Ambulatory Visit: Payer: Self-pay | Admitting: *Deleted

## 2022-03-16 DIAGNOSIS — Z111 Encounter for screening for respiratory tuberculosis: Secondary | ICD-10-CM

## 2022-03-16 DIAGNOSIS — Z79899 Other long term (current) drug therapy: Secondary | ICD-10-CM

## 2022-03-16 DIAGNOSIS — Z9225 Personal history of immunosupression therapy: Secondary | ICD-10-CM

## 2022-03-17 NOTE — Progress Notes (Signed)
CBC and CMP are normal.  Glucose is mildly elevated, probably not a fasting sample.

## 2022-03-18 LAB — QUANTIFERON-TB GOLD PLUS
Mitogen-NIL: >10 IU/mL
NIL: 0.05 IU/mL
QuantiFERON-TB Gold Plus: NEGATIVE
TB1-NIL: 0 IU/mL
TB2-NIL: 0.02 IU/mL

## 2022-03-18 LAB — CBC WITH DIFFERENTIAL/PLATELET
Absolute Monocytes: 950 cells/uL (ref 200–950)
Basophils Absolute: 62 cells/uL (ref 0–200)
Basophils Relative: 0.7 %
Eosinophils Absolute: 141 cells/uL (ref 15–500)
Eosinophils Relative: 1.6 %
HCT: 44.5 % (ref 38.5–50.0)
Hemoglobin: 15.6 g/dL (ref 13.2–17.1)
Lymphs Abs: 3054 cells/uL (ref 850–3900)
MCH: 32.3 pg (ref 27.0–33.0)
MCHC: 35.1 g/dL (ref 32.0–36.0)
MCV: 92.1 fL (ref 80.0–100.0)
MPV: 9.5 fL (ref 7.5–12.5)
Monocytes Relative: 10.8 %
Neutro Abs: 4594 cells/uL (ref 1500–7800)
Neutrophils Relative %: 52.2 %
Platelets: 312 10*3/uL (ref 140–400)
RBC: 4.83 10*6/uL (ref 4.20–5.80)
RDW: 12.5 % (ref 11.0–15.0)
Total Lymphocyte: 34.7 %
WBC: 8.8 10*3/uL (ref 3.8–10.8)

## 2022-03-18 LAB — COMPLETE METABOLIC PANEL WITH GFR
AG Ratio: 1.3 (calc) (ref 1.0–2.5)
ALT: 27 U/L (ref 9–46)
AST: 16 U/L (ref 10–40)
Albumin: 4.6 g/dL (ref 3.6–5.1)
Alkaline phosphatase (APISO): 91 U/L (ref 36–130)
BUN: 11 mg/dL (ref 7–25)
CO2: 28 mmol/L (ref 20–32)
Calcium: 9.8 mg/dL (ref 8.6–10.3)
Chloride: 100 mmol/L (ref 98–110)
Creat: 0.85 mg/dL (ref 0.60–1.26)
Globulin: 3.6 g/dL (calc) (ref 1.9–3.7)
Glucose, Bld: 121 mg/dL — ABNORMAL HIGH (ref 65–99)
Potassium: 4.2 mmol/L (ref 3.5–5.3)
Sodium: 138 mmol/L (ref 135–146)
Total Bilirubin: 1 mg/dL (ref 0.2–1.2)
Total Protein: 8.2 g/dL — ABNORMAL HIGH (ref 6.1–8.1)
eGFR: 115 mL/min/{1.73_m2} (ref >=60)

## 2022-03-19 NOTE — Progress Notes (Signed)
TB Gold is negative.

## 2022-03-20 ENCOUNTER — Other Ambulatory Visit (HOSPITAL_COMMUNITY): Payer: Self-pay

## 2022-03-23 NOTE — Progress Notes (Signed)
Office Visit Note  Patient: Joshua English             Date of Birth: [birth year]           MRN: [id number]             PCP: Ferd Hibbs, NP Referring: Ferd Hibbs, NP Visit Date: 04/06/2022 Occupation: '@GUAROCC'$ @  Subjective:  Medication monitoring   History of Present Illness: Joshua English is a 37 y.o. male with history of psoriatic arthritis.  Patient remains on humira 40 mg sq injections every 14 days, methotrexate 7 tablets once weekly, and folic acid 2 mg daily.  Patient reports that he reduced the dose of methotrexate from 8 tablets to 7 tablets weekly in July 2023.  He states he has noticed some increased arthralgias and joint stiffness in his hands on the reduced dose of methotrexate but overall his symptoms have been stable.  He denies any Achilles tendinitis or plantar fasciitis.  He denies any SI joint pain at this time.  He has a mild outbreak of psoriasis behind both ears but denies any other new patches.  He denies needing any topical agents for symptomatic relief at this time.  He states that he continues to have chronic pain due to scoliosis of the spine.  He states he has difficulty sleeping at night due to nocturnal pain.  He is not taking Flexeril 10 mg at bedtime which helps to alleviate his symptoms. He denies any recent or recurrent infections.  He denies any new medical conditions.    Activities of Daily Living:  Patient reports morning stiffness for 30 minutes.   Patient Reports nocturnal pain.  Difficulty dressing/grooming: Denies Difficulty climbing stairs: Denies Difficulty getting out of chair: Denies Difficulty using hands for taps, buttons, cutlery, and/or writing: Denies  Review of Systems  Constitutional: Negative.  Negative for fatigue.  HENT: Negative.  Negative for mouth sores and mouth dryness.   Eyes: Negative.  Negative for dryness.  Respiratory: Negative.  Negative for shortness of breath.   Cardiovascular: Negative.  Negative for  chest pain and palpitations.  Gastrointestinal: Negative.  Negative for blood in stool, constipation and diarrhea.  Endocrine: Negative.  Negative for increased urination.  Genitourinary: Negative.  Negative for involuntary urination.  Musculoskeletal:  Positive for joint pain, joint pain, joint swelling and morning stiffness. Negative for gait problem, myalgias, muscle weakness, muscle tenderness and myalgias.  Skin:  Positive for rash. Negative for pallor, hair loss and sensitivity to sunlight.  Allergic/Immunologic: Negative.  Negative for susceptible to infections.  Neurological: Negative.  Negative for dizziness and headaches.  Hematological: Negative.  Negative for swollen glands.  Psychiatric/Behavioral:  Positive for sleep disturbance. Negative for depressed mood. The patient is not nervous/anxious.     PMFS History:  Patient Active Problem List   Diagnosis Date Noted   Psoriatic arthritis ([place]) 02/21/2019   Other psoriasis 02/21/2019   High risk medication use 02/21/2019    Past Medical History:  Diagnosis Date   Dysplastic nevus 05/14/2021   R upper back - moderate   Psoriatic arthritis ([place])    Scliosis    arthritis -Psoriatic   Scoliosis     Family History  Problem Relation Age of Onset   Heart attack Father    Diabetes Maternal Grandmother    Hypertension Maternal Grandmother    Kidney disease Maternal Grandmother    Past Surgical History:  Procedure Laterality Date   BACK SURGERY  1994   for scoliosis   INSERTION  OF MESH N/A 11/09/2016   Procedure: INSERTION OF MESH;  Surgeon: Jovita Kussmaul, MD;  Location: [facility];  Service: General;  Laterality: N/A;   KNEE SURGERY Left    menicus repair    MOLE REMOVAL  04/2021   TONSILLECTOMY     UMBILICAL HERNIA REPAIR N/A 11/09/2016   Procedure: HERNIA REPAIR UMBILICAL ADULT;  Surgeon: Autumn Messing III, MD;  Location: [facility];  Service: General;  Laterality: N/A;   Social History   Social History Narrative   Not on  file   Immunization History  Administered Date(s) Administered   Influenza-Unspecified 11/09/2017   PFIZER(Purple Top)SARS-COV-2 Vaccination 03/13/2019, 04/17/2019   Tdap 10/03/2015     Objective: Vital Signs: BP (!) 135/95 (BP Location: Left Arm, Patient Position: Sitting, Cuff Size: Normal)   Pulse 84   Resp 16   Ht '5\' 6"'$  (1.676 m)   Wt 198 lb (89.8 kg)   BMI 31.96 kg/m    Physical Exam Vitals and nursing note reviewed.  Constitutional:      Appearance: He is well-developed.  HENT:     Head: Normocephalic and atraumatic.  Eyes:     Conjunctiva/sclera: Conjunctivae normal.     Pupils: Pupils are equal, round, and reactive to light.  Cardiovascular:     Rate and Rhythm: Normal rate and regular rhythm.     Heart sounds: Normal heart sounds.  Pulmonary:     Effort: Pulmonary effort is normal.     Breath sounds: Normal breath sounds.  Abdominal:     General: Bowel sounds are normal.     Palpations: Abdomen is soft.  Musculoskeletal:     Cervical back: Normal range of motion and neck supple.  Skin:    General: Skin is warm and dry.     Capillary Refill: Capillary refill takes less than 2 seconds.  Neurological:     Mental Status: He is alert and oriented to person, place, and time.  Psychiatric:        Behavior: Behavior normal.      Musculoskeletal Exam: C-spine has good ROM.  Thoracolumbar scoliosis.  No SI joint tenderness.  Shoulder joints, elbow joints, wrist joints, MCPs, PIPs, and DIPs good ROM with no synovitis.  Complete fist formation bilaterally.  Tenderness of the left 1st PIP. Knee joints have good ROM with no warmth or effusion.  Ankle joints have good ROM with no tenderness or synovitis.  No evidence of achilles tendonitis.   CDAI Exam: CDAI Score: -- Patient Global: --; Provider Global: -- Swollen: --; Tender: -- Joint Exam 04/06/2022   No joint exam has been documented for this visit   There is currently no information documented on the  homunculus. Go to the Rheumatology activity and complete the homunculus joint exam.  Investigation: No additional findings.  Imaging: No results found.  Recent Labs: Lab Results  Component Value Date   WBC 8.8 03/16/2022   HGB 15.6 03/16/2022   PLT 312 03/16/2022   NA 138 03/16/2022   K 4.2 03/16/2022   CL 100 03/16/2022   CO2 28 03/16/2022   GLUCOSE 121 (H) 03/16/2022   BUN 11 03/16/2022   CREATININE 0.85 03/16/2022   BILITOT 1.0 03/16/2022   AST 16 03/16/2022   ALT 27 03/16/2022   PROT 8.2 (H) 03/16/2022   CALCIUM 9.8 03/16/2022   GFRAA 137 07/25/2020   QFTBGOLDPLUS NEGATIVE 03/16/2022    Speciality Comments: Treatment by Dr. Dionisio Paschal response, Remicade-insurance issues Humira start-02/21  Procedures:  No  procedures performed Allergies: Patient has no known allergies.    Assessment / Plan:     Visit Diagnoses: Psoriatic arthritis ([place]) - Diagnosed while he was in college.  Previous patient of Dr. Dossie Der.  (Enbrel -inadequate response, Remicade discontinued due to insurance issues): He has no synovitis or dactylitis on examination today.  No evidence of Achilles tendinitis or plantar fasciitis.  No SI joint tenderness upon palpation.  No signs or symptoms of uveitis.  He has clinically been doing well on Humira 40 mg subcutaneous injections every 14 days, methotrexate 7 tablets by mouth once weekly, and folic acid 2 mg daily.  He has been tolerating combination therapy without any side effects or missed doses.  He has noticed some increased discomfort and stiffness in his hands since reducing the dose of methotrexate from 8 tablets to 7 tablets weekly in July 2023.  Discussed switching to injectable methotrexate but he would like to hold off at this time.  No medication changes will be made at this time.  He is advised to notify us if he develops signs or symptoms of a flare.  He will follow-up in the office in 5 months or sooner if needed.  Psoriasis: Small  patches behind both ears.  Declined a prescription for a topical agent at this time.   High risk medication use - Humira 40 mg sq injection every 14 days, Methotrexate 2.5 mg 7 tablets once weekly, and folic acid 1 mg 2 tablets by mouth daily. Reduced dose of MTX to 7 tablets weekly due to elevated ALT-LFTs have returned to WNL.  CBC and CMP updated on 03/16/22. His next lab work will be due in May and every 3 months.  TB gold negative on 03/16/22.  No recent or recurrent infections.  Discussed the importance of holding humira and MTX if he develops signs or symptoms of an infection and to resume once the infection has completely cleared.   Primary osteoarthritis of both hands: He has some tenderness of the left first PIP joint but no synovitis or dactylitis noted.  Complete fist formation bilaterally.  He experiences occasional aching and stiffness in his hands but has not noticed any inflammation.  Primary osteoarthritis of both feet: He experiences occasional discomfort in his feet but no inflammation.  No evidence of Achilles tendinitis or plantar fasciitis.  No tenderness or synovitis of the ankle joints.  Chronic SI joint pain: He has no SI joint tenderness upon palpation today.  History of thoracic spinal fusion - Congenital scoliosis, surgery at age of 55 he has chronic discomfort in his thoracic and lumbar region.  He is taking Flexeril 10 mg by bedtime for muscle spasms and insomnia.  He does not need a refill currently.   Essential hypertension: BP was 135/95 today in the office. Blood pressure was rechecked prior to the patient leaving today.  He should reach out of his PCP if his BP remains elevated.    Orders: No orders of the defined types were placed in this encounter.  No orders of the defined types were placed in this encounter.   Follow-Up Instructions: Return in about 5 months (around 09/04/2022) for Psoriatic arthritis.   Ofilia Neas, PA-C  Note - This record has been  created using Dragon software.  Chart creation errors have been sought, but may not always  have been located. Such creation errors do not reflect on  the standard of medical care.

## 2022-04-06 ENCOUNTER — Encounter: Payer: Self-pay | Admitting: Physician Assistant

## 2022-04-06 ENCOUNTER — Ambulatory Visit: Payer: Medicaid Other | Attending: Physician Assistant | Admitting: Physician Assistant

## 2022-04-06 VITALS — BP 135/95 | HR 84 | Resp 16 | Ht 66.0 in | Wt 198.0 lb

## 2022-04-06 DIAGNOSIS — M19041 Primary osteoarthritis, right hand: Secondary | ICD-10-CM

## 2022-04-06 DIAGNOSIS — M19072 Primary osteoarthritis, left ankle and foot: Secondary | ICD-10-CM

## 2022-04-06 DIAGNOSIS — M19071 Primary osteoarthritis, right ankle and foot: Secondary | ICD-10-CM

## 2022-04-06 DIAGNOSIS — G8929 Other chronic pain: Secondary | ICD-10-CM

## 2022-04-06 DIAGNOSIS — L409 Psoriasis, unspecified: Secondary | ICD-10-CM

## 2022-04-06 DIAGNOSIS — Z79899 Other long term (current) drug therapy: Secondary | ICD-10-CM

## 2022-04-06 DIAGNOSIS — L4050 Arthropathic psoriasis, unspecified: Secondary | ICD-10-CM

## 2022-04-06 DIAGNOSIS — M533 Sacrococcygeal disorders, not elsewhere classified: Secondary | ICD-10-CM

## 2022-04-06 DIAGNOSIS — M19042 Primary osteoarthritis, left hand: Secondary | ICD-10-CM

## 2022-04-06 DIAGNOSIS — Z981 Arthrodesis status: Secondary | ICD-10-CM

## 2022-04-06 DIAGNOSIS — I10 Essential (primary) hypertension: Secondary | ICD-10-CM

## 2022-04-06 NOTE — Patient Instructions (Addendum)
Standing Labs We placed an order today for your standing lab work.   Please have your standing labs drawn in May and every 3 months   Please have your labs drawn 2 weeks prior to your appointment so that the provider can discuss your lab results at your appointment, if possible.  Please note that you may see your imaging and lab results in [facility] before we have reviewed them. We will contact you once all results are reviewed. Please allow our office up to 72 hours to thoroughly review all of the results before contacting the office for clarification of your results.  WALK-IN LAB HOURS  Monday through Thursday from 8:00 am -12:30 pm and 1:00 pm-5:00 pm and Friday from 8:00 am-12:00 pm.  Patients with office visits requiring labs will be seen before walk-in labs.  You may encounter longer than normal wait times. Please allow additional time. Wait times may be shorter on  Monday and Thursday afternoons.  We do not book appointments for walk-in labs. We appreciate your patience and understanding with our staff.   Labs are drawn by [facility]. Please bring your co-pay at the time of your lab draw.  You may receive a bill from [facility] for your lab work.  Please note if you are on Hydroxychloroquine and and an order has been placed for a Hydroxychloroquine level,  you will need to have it drawn 4 hours or more after your last dose.  If you wish to have your labs drawn at another location, please call the office 24 hours in advance so we can fax the orders.  The office is located at [address], [place], [place], [place] [address]   If you have any questions regarding directions or hours of operation,  please call [phone].   As a reminder, please drink plenty of water prior to coming for your lab work. Thanks!  If you have signs or symptoms of an infection or start antibiotics: First, call your PCP for workup of your infection. Hold your medication through the infection, until you  complete your antibiotics, and until symptoms resolve if you take the following: Injectable medication (Actemra, Benlysta, Cimzia, Cosentyx, Enbrel, Humira, Kevzara, Orencia, Remicade, Simponi, Stelara, Taltz, Tremfya) Methotrexate Leflunomide (Arava) Mycophenolate (Cellcept) Morrie Sheldon, Olumiant, or Rinvoq Vaccines You are taking a medication(s) that can suppress your immune system.  The following immunizations are recommended: Flu annually Covid-19  Td/Tdap (tetanus, diphtheria, pertussis) every 10 years Pneumonia (Prevnar 15 then Pneumovax 23 at least 1 year apart.  Alternatively, can take Prevnar 20 without needing additional dose) Shingrix: 2 doses from 4 weeks to 6 months apart  Please check with your PCP to make sure you are up to date.

## 2022-04-10 ENCOUNTER — Other Ambulatory Visit: Payer: Self-pay | Admitting: Physician Assistant

## 2022-04-10 NOTE — Telephone Encounter (Signed)
Next Visit: 09/09/2022  Last Visit: 04/06/2022  Last Fill: 03/13/2022  Dx: History of thoracic spinal fusion   Current Dose per office note on 04/06/2022: Flexeril 10 mg by bedtime for muscle spasms   Okay to refill Flexeril?

## 2022-04-14 ENCOUNTER — Other Ambulatory Visit: Payer: Self-pay | Admitting: Rheumatology

## 2022-04-14 ENCOUNTER — Other Ambulatory Visit (HOSPITAL_COMMUNITY): Payer: Self-pay

## 2022-04-14 DIAGNOSIS — L4050 Arthropathic psoriasis, unspecified: Secondary | ICD-10-CM

## 2022-04-14 MED ORDER — HUMIRA (2 PEN) 40 MG/0.4ML ~~LOC~~ AJKT
AUTO-INJECTOR | SUBCUTANEOUS | 2 refills | Status: DC
  Filled 2022-04-14: qty 2, 28d supply, fill #0
  Filled 2022-05-13: qty 2, 28d supply, fill #1
  Filled 2022-06-03: qty 2, 28d supply, fill #2

## 2022-04-14 NOTE — Telephone Encounter (Signed)
Next Visit: 09/09/2022  Last Visit: 04/06/2022  Last Fill: 03/12/2022 (30 day supply)  DX: Psoriatic arthritis   Current Dose per office note 04/06/2022: Humira 40 mg sq injection every 14 days   Labs: 03/16/2022 CBC and CMP are normal.  Glucose is mildly elevated, probably not a fasting sample.   TB Gold: 03/16/2022 Neg    Okay to refill Humira?

## 2022-04-15 ENCOUNTER — Other Ambulatory Visit (HOSPITAL_COMMUNITY): Payer: Self-pay

## 2022-04-16 ENCOUNTER — Telehealth: Payer: Self-pay

## 2022-04-16 ENCOUNTER — Telehealth: Payer: Self-pay | Admitting: *Deleted

## 2022-04-16 NOTE — Telephone Encounter (Signed)
Patient contacted the office to check on his prescription for Cyclobenzaprine. Patient contacted his pharmacy and they informed him that they had not received his prescription. The patient's pharmacy is the [facility] on [address]. Patient advised we sent his prescription on 04/10/2022. Contacted  patient's pharmacy to confirm they received the prescription for Cyclobenzaprine. Pharmacist confirmed they received prescription. Pharmacy stated that they are waiting on a prior authorization. Advised pharmacist we have not received prior authorization request. She will resend now. Completed PA via cover my meds. Contacted the patient and advised that the pharmacy received his prescription and that they needed a prior authorization. Patient advised that we did not get the prior authorization request until today. Patient advised the prior authorization has been completed. Patient advised we are now waiting on his insurance.

## 2022-04-16 NOTE — Telephone Encounter (Signed)
No Prior Authorization required.

## 2022-04-16 NOTE — Telephone Encounter (Signed)
Submitted a Prior Authorization request to [place] for  Flexeril  via CoverMyMeds. Will update once we receive a response.  Key: [id number]

## 2022-04-27 ENCOUNTER — Other Ambulatory Visit: Payer: Self-pay | Admitting: Physician Assistant

## 2022-04-27 NOTE — Telephone Encounter (Signed)
Next Visit: 09/09/2022  Last Visit: 04/06/2022  Last Fill: 02/04/2022  DX: Psoriatic arthritis   Current Dose per office note 04/06/2022: Methotrexate 2.5 mg 7 tablets once weekly   Labs: 03/16/2022 CBC and CMP are normal.  Glucose is mildly elevated, probably not a fasting sample.   Okay to refill MTX?

## 2022-05-12 ENCOUNTER — Other Ambulatory Visit (HOSPITAL_COMMUNITY): Payer: Self-pay

## 2022-05-13 ENCOUNTER — Other Ambulatory Visit (HOSPITAL_COMMUNITY): Payer: Self-pay

## 2022-05-20 ENCOUNTER — Ambulatory Visit (INDEPENDENT_AMBULATORY_CARE_PROVIDER_SITE_OTHER): Payer: Medicaid Other | Admitting: Dermatology

## 2022-05-20 VITALS — BP 136/82 | HR 89

## 2022-05-20 DIAGNOSIS — Z1283 Encounter for screening for malignant neoplasm of skin: Secondary | ICD-10-CM

## 2022-05-20 DIAGNOSIS — L578 Other skin changes due to chronic exposure to nonionizing radiation: Secondary | ICD-10-CM | POA: Diagnosis not present

## 2022-05-20 DIAGNOSIS — D2262 Melanocytic nevi of left upper limb, including shoulder: Secondary | ICD-10-CM

## 2022-05-20 DIAGNOSIS — D229 Melanocytic nevi, unspecified: Secondary | ICD-10-CM | POA: Diagnosis not present

## 2022-05-20 DIAGNOSIS — L814 Other melanin hyperpigmentation: Secondary | ICD-10-CM

## 2022-05-20 DIAGNOSIS — L821 Other seborrheic keratosis: Secondary | ICD-10-CM

## 2022-05-20 DIAGNOSIS — D492 Neoplasm of unspecified behavior of bone, soft tissue, and skin: Secondary | ICD-10-CM

## 2022-05-20 DIAGNOSIS — Z86018 Personal history of other benign neoplasm: Secondary | ICD-10-CM

## 2022-05-20 NOTE — Patient Instructions (Addendum)
Wound Care Instructions  Cleanse wound gently with soap and water once a day then pat dry with clean gauze. Apply a thin coat of Petrolatum (petroleum jelly, "Vaseline") over the wound (unless you have an allergy to this). We recommend that you use a new, sterile tube of Vaseline. Do not pick or remove scabs. Do not remove the yellow or white "healing tissue" from the base of the wound.  Cover the wound with fresh, clean, nonstick gauze and secure with paper tape. You may use Band-Aids in place of gauze and tape if the wound is small enough, but would recommend trimming much of the tape off as there is often too much. Sometimes Band-Aids can irritate the skin.  You should call the office for your biopsy report after 1 week if you have not already been contacted.  If you experience any problems, such as abnormal amounts of bleeding, swelling, significant bruising, significant pain, or evidence of infection, please call the office immediately.  FOR ADULT SURGERY PATIENTS: If you need something for pain relief you may take 1 extra strength Tylenol (acetaminophen) AND 2 Ibuprofen (200mg each) together every 4 hours as needed for pain. (do not take these if you are allergic to them or if you have a reason you should not take them.) Typically, you may only need pain medication for 1 to 3 days.     Due to recent changes in healthcare laws, you may see results of your pathology and/or laboratory studies on MyChart before the doctors have had a chance to review them. We understand that in some cases there may be results that are confusing or concerning to you. Please understand that not all results are received at the same time and often the doctors may need to interpret multiple results in order to provide you with the best plan of care or course of treatment. Therefore, we ask that you please give us 2 business days to thoroughly review all your results before contacting the office for clarification. Should  we see a critical lab result, you will be contacted sooner.   If You Need Anything After Your Visit  If you have any questions or concerns for your doctor, please call our main line at [phone] and press option 4 to reach your doctor's medical assistant. If no one answers, please leave a voicemail as directed and we will return your call as soon as possible. Messages left after 4 pm will be answered the following business day.   You may also send us a message via MyChart. We typically respond to MyChart messages within 1-2 business days.  For prescription refills, please ask your pharmacy to contact our office. Our fax number is [phone].  If you have an urgent issue when the clinic is closed that cannot wait until the next business day, you can page your doctor at the number below.    Please note that while we do our best to be available for urgent issues outside of office hours, we are not available 24/7.   If you have an urgent issue and are unable to reach us, you may choose to seek medical care at your doctor's office, retail clinic, urgent care center, or emergency room.  If you have a medical emergency, please immediately call 911 or go to the emergency department.  Pager Numbers  - Dr. Kowalski: [phone]  - Dr. Moye: [phone]  - Dr. Stewart: [phone]  In the event of inclement weather, please call our main line at   [phone] for an update on the status of any delays or closures.  Dermatology Medication Tips: Please keep the boxes that topical medications come in in order to help keep track of the instructions about where and how to use these. Pharmacies typically print the medication instructions only on the boxes and not directly on the medication tubes.   If your medication is too expensive, please contact our office at [phone] option 4 or send us a message through MyChart.   We are unable to tell what your co-pay for medications will be in  advance as this is different depending on your insurance coverage. However, we may be able to find a substitute medication at lower cost or fill out paperwork to get insurance to cover a needed medication.   If a prior authorization is required to get your medication covered by your insurance company, please allow us 1-2 business days to complete this process.  Drug prices often vary depending on where the prescription is filled and some pharmacies may offer cheaper prices.  The website www.goodrx.com contains coupons for medications through different pharmacies. The prices here do not account for what the cost may be with help from insurance (it may be cheaper with your insurance), but the website can give you the price if you did not use any insurance.  - You can print the associated coupon and take it with your prescription to the pharmacy.  - You may also stop by our office during regular business hours and pick up a GoodRx coupon card.  - If you need your prescription sent electronically to a different pharmacy, notify our office through [facility] MyChart or by phone at [phone] option 4.     Si Usted Necesita Algo Despus de Su Visita  Tambin puede enviarnos un mensaje a travs de MyChart. Por lo general respondemos a los mensajes de MyChart en el transcurso de 1 a 2 das hbiles.  Para renovar recetas, por favor pida a su farmacia que se ponga en contacto con nuestra oficina. Nuestro nmero de fax es el [phone].  Si tiene un asunto urgente cuando la clnica est cerrada y que no puede esperar hasta el siguiente da hbil, puede llamar/localizar a su doctor(a) al nmero que aparece a continuacin.   Por favor, tenga en cuenta que aunque hacemos todo lo posible para estar disponibles para asuntos urgentes fuera del horario de oficina, no estamos disponibles las 24 horas del da, los 7 das de la semana.   Si tiene un problema urgente y no puede comunicarse con nosotros, puede  optar por buscar atencin mdica  en el consultorio de su doctor(a), en una clnica privada, en un centro de atencin urgente o en una sala de emergencias.  Si tiene una emergencia mdica, por favor llame inmediatamente al 911 o vaya a la sala de emergencias.  Nmeros de bper  - Dr. Kowalski: [phone]  - Dra. Moye: [phone]  - Dra. Stewart: [phone]  En caso de inclemencias del tiempo, por favor llame a nuestra lnea principal al [phone] para una actualizacin sobre el estado de cualquier retraso o cierre.  Consejos para la medicacin en dermatologa: Por favor, guarde las cajas en las que vienen los medicamentos de uso tpico para ayudarle a seguir las instrucciones sobre dnde y cmo usarlos. Las farmacias generalmente imprimen las instrucciones del medicamento slo en las cajas y no directamente en los tubos del medicamento.   Si su medicamento es muy caro, por favor, pngase en contacto con   nuestra oficina llamando al [phone] y presione la opcin 4 o envenos un mensaje a travs de MyChart.   No podemos decirle cul ser su copago por los medicamentos por adelantado ya que esto es diferente dependiendo de la cobertura de su seguro. Sin embargo, es posible que podamos encontrar un medicamento sustituto a menor costo o llenar un formulario para que el seguro cubra el medicamento que se considera necesario.   Si se requiere una autorizacin previa para que su compaa de seguros cubra su medicamento, por favor permtanos de 1 a 2 das hbiles para completar este proceso.  Los precios de los medicamentos varan con frecuencia dependiendo del lugar de dnde se surte la receta y alguna farmacias pueden ofrecer precios ms baratos.  El sitio web www.goodrx.com tiene cupones para medicamentos de diferentes farmacias. Los precios aqu no tienen en cuenta lo que podra costar con la ayuda del seguro (puede ser ms barato con su seguro), pero el sitio web puede darle el  precio si no utiliz ningn seguro.  - Puede imprimir el cupn correspondiente y llevarlo con su receta a la farmacia.  - Tambin puede pasar por nuestra oficina durante el horario de atencin regular y recoger una tarjeta de cupones de GoodRx.  - Si necesita que su receta se enve electrnicamente a una farmacia diferente, informe a nuestra oficina a travs de MyChart de [employer] o por telfono llamando al [phone] y presione la opcin 4.  

## 2022-05-20 NOTE — Progress Notes (Signed)
   Follow-Up Visit   Subjective  Joshua English is a 37 y.o. male who presents for the following: Skin Cancer Screening and Full Body Skin Exam, hx of Dysplastic nevus.  The patient presents for Total-Body Skin Exam (TBSE) for skin cancer screening and mole check. The patient has spots, moles and lesions to be evaluated, some may be new or changing and the patient has concerns that these could be cancer.    The following portions of the chart were reviewed this encounter and updated as appropriate: medications, allergies, medical history  Review of Systems:  No other skin or systemic complaints except as noted in HPI or Assessment and Plan.  Objective  Well appearing patient in no apparent distress; mood and affect are within normal limits.  A full examination was performed including scalp, head, eyes, ears, nose, lips, neck, chest, axillae, abdomen, back, buttocks, bilateral upper extremities, bilateral lower extremities, hands, feet, fingers, toes, fingernails, and toenails. All findings within normal limits unless otherwise noted below.   Relevant physical exam findings are noted in the Assessment and Plan.  Left Upper Arm - Anterior 0.6 cm Irregular brown macule        Assessment & Plan   LENTIGINES, SEBORRHEIC KERATOSES, HEMANGIOMAS - Benign normal skin lesions - Benign-appearing - Call for any changes  MELANOCYTIC NEVI - Tan-brown and/or pink-flesh-colored symmetric macules and papules - Benign appearing on exam today - Observation - Call clinic for new or changing moles - Recommend daily use of broad spectrum spf 30+ sunscreen to sun-exposed areas.   ACTINIC DAMAGE - Chronic condition, secondary to cumulative UV/sun exposure - diffuse scaly erythematous macules with underlying dyspigmentation - Recommend daily broad spectrum sunscreen SPF 30+ to sun-exposed areas, reapply every 2 hours as needed.  - Staying in the shade or wearing long sleeves, sun glasses  (UVA+UVB protection) and wide brim hats (4-inch brim around the entire circumference of the hat) are also recommended for sun protection.  - Call for new or changing lesions.  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  Call if any new or changing lesions are noted between office visits   SKIN CANCER SCREENING PERFORMED TODAY.  Neoplasm of skin Left Upper Arm - Anterior  Epidermal / dermal shaving  Lesion diameter (cm):  0.6 Informed consent: discussed and consent obtained   Patient was prepped and draped in usual sterile fashion: area prepped with alcohol. Anesthesia: the lesion was anesthetized in a standard fashion   Anesthetic:  1% lidocaine w/ epinephrine 1-100,000 buffered w/ 8.4% NaHCO3 Instrument used: flexible razor blade   Hemostasis achieved with: pressure, aluminum chloride and electrodesiccation   Outcome: patient tolerated procedure well   Post-procedure details: wound care instructions given   Post-procedure details comment:  Ointment and small bandage applied  Specimen 1 - Surgical pathology Differential Diagnosis: R/O Dysplastic nevus   Check Margins: No   Return in about 1 year (around 05/20/2023) for TBSE, hx of Dysplastic nevus .  IAngelique Holm, CMA, am acting as scribe for Armida Sans, MD .   Documentation: I have reviewed the above documentation for accuracy and completeness, and I agree with the above.  Armida Sans, MD

## 2022-05-25 ENCOUNTER — Telehealth: Payer: Self-pay

## 2022-05-25 NOTE — Telephone Encounter (Signed)
Advised patient of results/hd  

## 2022-05-25 NOTE — Telephone Encounter (Signed)
-----   Message from David C Kowalski, MD sent at 05/24/2022  6:28 PM EDT ----- Diagnosis Skin , left upper arm - anterior DYSPLASTIC COMPOUND NEVUS WITH MODERATE ATYPIA, CLOSE TO MARGIN  Moderate dysplastic Recheck next visit 

## 2022-05-25 NOTE — Telephone Encounter (Signed)
-----   Message from Deirdre Evener, MD sent at 05/24/2022  6:28 PM EDT ----- Diagnosis Skin , left upper arm - anterior DYSPLASTIC COMPOUND NEVUS WITH MODERATE ATYPIA, CLOSE TO MARGIN  Moderate dysplastic Recheck next visit

## 2022-06-02 ENCOUNTER — Other Ambulatory Visit: Payer: Self-pay | Admitting: Physician Assistant

## 2022-06-02 NOTE — Telephone Encounter (Signed)
Last Fill: 04/10/2022  Next Visit: 09/09/2022  Last Visit: 04/06/2022   Dx: History of thoracic spinal fusion   Current Dose per office note on 04/06/2022: Flexeril 10 mg by bedtime for muscle spasms   Okay to refill Flexeril?

## 2022-06-03 ENCOUNTER — Other Ambulatory Visit (HOSPITAL_COMMUNITY): Payer: Self-pay

## 2022-06-05 ENCOUNTER — Other Ambulatory Visit (HOSPITAL_COMMUNITY): Payer: Self-pay

## 2022-06-07 ENCOUNTER — Encounter: Payer: Self-pay | Admitting: Dermatology

## 2022-06-30 ENCOUNTER — Other Ambulatory Visit (HOSPITAL_COMMUNITY): Payer: Self-pay

## 2022-06-30 ENCOUNTER — Other Ambulatory Visit: Payer: Self-pay | Admitting: Rheumatology

## 2022-06-30 ENCOUNTER — Other Ambulatory Visit: Payer: Self-pay

## 2022-06-30 DIAGNOSIS — L4050 Arthropathic psoriasis, unspecified: Secondary | ICD-10-CM

## 2022-06-30 MED ORDER — HUMIRA (2 PEN) 40 MG/0.4ML ~~LOC~~ AJKT
AUTO-INJECTOR | SUBCUTANEOUS | 0 refills | Status: DC
  Filled 2022-06-30: qty 2, 28d supply, fill #0

## 2022-06-30 NOTE — Telephone Encounter (Signed)
Last Fill: 04/14/2022  Labs: 03/16/2022 CBC and CMP are normal.  Glucose is mildly elevated, probably not a fasting sample.   TB Gold: 03/16/2022 Neg    Next Visit: 09/09/2022  Last Visit: 04/06/2022  [id number] arthritis   Current Dose per office note 04/06/2022: Humira 40 mg sq injection every 14 days   Patient advised he is due to update labs and states he will update labs this week.   Okay to refill Humira?

## 2022-07-01 ENCOUNTER — Other Ambulatory Visit: Payer: Self-pay | Admitting: *Deleted

## 2022-07-01 DIAGNOSIS — Z79899 Other long term (current) drug therapy: Secondary | ICD-10-CM

## 2022-07-02 ENCOUNTER — Other Ambulatory Visit: Payer: Self-pay | Admitting: Physician Assistant

## 2022-07-02 LAB — CBC WITH DIFFERENTIAL/PLATELET
Absolute Monocytes: 616 cells/uL (ref 200–950)
Basophils Absolute: 47 cells/uL (ref 0–200)
Basophils Relative: 0.6 %
Eosinophils Absolute: 133 cells/uL (ref 15–500)
Eosinophils Relative: 1.7 %
HCT: 41.5 % (ref 38.5–50.0)
Hemoglobin: 14 g/dL (ref 13.2–17.1)
Lymphs Abs: 3206 cells/uL (ref 850–3900)
MCH: 31.9 pg (ref 27.0–33.0)
MCHC: 33.7 g/dL (ref 32.0–36.0)
MCV: 94.5 fL (ref 80.0–100.0)
MPV: 9.6 fL (ref 7.5–12.5)
Monocytes Relative: 7.9 %
Neutro Abs: 3799 cells/uL (ref 1500–7800)
Neutrophils Relative %: 48.7 %
Platelets: 296 10*3/uL (ref 140–400)
RBC: 4.39 10*6/uL (ref 4.20–5.80)
RDW: 12.8 % (ref 11.0–15.0)
Total Lymphocyte: 41.1 %
WBC: 7.8 10*3/uL (ref 3.8–10.8)

## 2022-07-02 LAB — COMPLETE METABOLIC PANEL WITH GFR
AG Ratio: 1.4 (calc) (ref 1.0–2.5)
ALT: 24 U/L (ref 9–46)
AST: 17 U/L (ref 10–40)
Albumin: 4.2 g/dL (ref 3.6–5.1)
Alkaline phosphatase (APISO): 84 U/L (ref 36–130)
BUN: 10 mg/dL (ref 7–25)
CO2: 27 mmol/L (ref 20–32)
Calcium: 9 mg/dL (ref 8.6–10.3)
Chloride: 103 mmol/L (ref 98–110)
Creat: 0.77 mg/dL (ref 0.60–1.26)
Globulin: 2.9 g/dL (calc) (ref 1.9–3.7)
Glucose, Bld: 105 mg/dL — ABNORMAL HIGH (ref 65–99)
Potassium: 4 mmol/L (ref 3.5–5.3)
Sodium: 139 mmol/L (ref 135–146)
Total Bilirubin: 0.7 mg/dL (ref 0.2–1.2)
Total Protein: 7.1 g/dL (ref 6.1–8.1)
eGFR: 118 mL/min/{1.73_m2} (ref >=60)

## 2022-07-02 NOTE — Progress Notes (Signed)
CBC and CMP normal

## 2022-07-02 NOTE — Telephone Encounter (Signed)
Last Fill: 06/02/2022  Next Visit: 09/09/2022  Last Visit: 04/06/2022  Dx: History of thoracic spinal fusion   Current Dose per office note on 04/06/2022: Flexeril 10 mg by bedtime for muscle spasms and insomnia   Okay to refill Flexeril?

## 2022-07-07 ENCOUNTER — Other Ambulatory Visit (HOSPITAL_COMMUNITY): Payer: Self-pay

## 2022-07-14 ENCOUNTER — Other Ambulatory Visit: Payer: Self-pay | Admitting: Physician Assistant

## 2022-07-14 NOTE — Telephone Encounter (Signed)
Last Fill: 04/27/2022  Labs: 07/01/2022 CBC and CMP normal.   Next Visit: 09/09/2022  Last Visit: 04/06/2022  DX: Psoriatic arthritis   Current Dose per office note 04/06/2022: Methotrexate 2.5 mg 7 tablets once weekly   Okay to refill Methotrexate?

## 2022-07-31 ENCOUNTER — Other Ambulatory Visit (HOSPITAL_COMMUNITY): Payer: Self-pay

## 2022-07-31 ENCOUNTER — Other Ambulatory Visit: Payer: Self-pay

## 2022-07-31 ENCOUNTER — Other Ambulatory Visit: Payer: Self-pay | Admitting: Physician Assistant

## 2022-07-31 DIAGNOSIS — L4050 Arthropathic psoriasis, unspecified: Secondary | ICD-10-CM

## 2022-07-31 MED ORDER — HUMIRA (2 PEN) 40 MG/0.4ML ~~LOC~~ AJKT
AUTO-INJECTOR | SUBCUTANEOUS | 0 refills | Status: DC
  Filled 2022-07-31: qty 2, 28d supply, fill #0

## 2022-07-31 NOTE — Telephone Encounter (Signed)
Last Fill: 06/30/2022  Labs: 07/01/2022 CBC and CMP normal.   TB Gold: 03/16/2022  TB Gold is negative.   Next Visit: 09/09/2022  Last Visit: 04/06/2022  [id number] arthritis   Current Dose per office note 04/06/2022: Humira 40 mg sq injection every 14 days   Okay to refill Humira?

## 2022-08-05 ENCOUNTER — Other Ambulatory Visit (HOSPITAL_COMMUNITY): Payer: Self-pay

## 2022-08-07 ENCOUNTER — Other Ambulatory Visit: Payer: Self-pay

## 2022-08-07 MED ORDER — CYCLOBENZAPRINE HCL 10 MG PO TABS: 10.0000 mg | ORAL_TABLET | Freq: Every day | ORAL | 0 refills | Status: DC

## 2022-08-07 NOTE — Telephone Encounter (Signed)
Patient contacted the office and states he would like a refill of Flexeril sent to [facility] on [address].   Last Fill: 07/02/2022  Next Visit: 09/09/2022  Last Visit: 04/06/2022  Dx: History of thoracic spinal fusion   Current Dose per office note on 04/06/2022: Flexeril 10 mg by bedtime for muscle spasms and insomnia   Okay to refill Flexeril?

## 2022-08-20 ENCOUNTER — Telehealth: Payer: Self-pay | Admitting: *Deleted

## 2022-08-20 NOTE — Telephone Encounter (Signed)
Patient contacted the office stating he has been trying to get his MTX filled by the pharmacy for 10 days. Patient states he could not get a response as to what was going on until yesterday. Patient states he was advised that the MTX was on back order. I advised patient he would need to contact other pharmacies to see who has the MTX in stock and then let us know. I advised patient we would be happy to send the prescription to whatever pharmacy he finds that has the MTX. Patient got upset stating this is ridiculous. He should not have to call the pharmacy to find where it is in stock. Patient states he is on vacation. Patient states he has never missed a dose of his medication. Patient asked what would happen if he missed a dose. Patient advised there may be a potential for a flare if there is a missed dose. Patient advised if he will reach out to find a pharmacy to find the MTX in stock we can send in the prescription so he won't miss his dose.

## 2022-08-26 NOTE — Progress Notes (Signed)
Office Visit Note  Patient: Joshua English             Date of Birth: 1986/01/02           MRN: [id number]             PCP: Lance Bosch, NP Referring: Lance Bosch, NP Visit Date: 09/09/2022 Occupation: @GUAROCC @  Subjective:  Medication monitoring   History of Present Illness: Franciscojavier Wronski is a 37 y.o. male with history of psoriatic arthritis and osteoarthritis. Patient remains on Humira 40 mg sq injection every 14 days, Methotrexate 2.5 mg 7 tablets once weekly, and folic acid 1 mg 2 tablets by mouth daily.  Patient remains on combination therapy without any side effects or injection site reactions.  He is due for his next dose of Humira tomorrow but has not yet received the shipment.  He denies any recent gaps in therapy.  He has not had any recent or recurrent infections.  He denies any signs or symptoms of a psoriatic arthritis flare.  He has 1 small patch of psoriasis behind the left ear but denies any other new patches.  He has not needed to use any topical agents recently.  He denies any Achilles tendinitis or plantar fasciitis.  He denies any SI joint pain.  He continues to have discomfort in his back due to scoliosis.  He takes Flexeril 10 mg 1 tablet at bedtime for muscle spasms at bedtime which helps him sleep at night.  Activities of Daily Living:  Patient reports morning stiffness for 30 minutes.   Patient Reports nocturnal pain.  Difficulty dressing/grooming: Denies Difficulty climbing stairs: Denies Difficulty getting out of chair: Denies Difficulty using hands for taps, buttons, cutlery, and/or writing: Denies  Review of Systems  Constitutional:  Positive for fatigue.  HENT:  Negative for mouth sores and mouth dryness.   Eyes:  Negative for dryness.  Respiratory:  Negative for shortness of breath.   Cardiovascular:  Negative for chest pain and palpitations.  Gastrointestinal:  Negative for blood in stool, constipation and diarrhea.  Endocrine: Negative for  increased urination.  Genitourinary:  Negative for involuntary urination.  Musculoskeletal:  Positive for joint pain, joint pain, joint swelling, myalgias, morning stiffness, muscle tenderness and myalgias. Negative for gait problem and muscle weakness.  Skin:  Negative for color change, rash, hair loss and sensitivity to sunlight.  Allergic/Immunologic: Negative for susceptible to infections.  Neurological:  Negative for dizziness, numbness and headaches.  Hematological:  Negative for swollen glands.  Psychiatric/Behavioral:  Positive for sleep disturbance. Negative for depressed mood. The patient is not nervous/anxious.     PMFS History:  Patient Active Problem List   Diagnosis Date Noted   Psoriatic arthritis (HCC) 02/21/2019   Other psoriasis 02/21/2019   High risk medication use 02/21/2019    Past Medical History:  Diagnosis Date   Dysplastic nevus 05/14/2021   R upper back - moderate   Dysplastic nevus 05/20/2022   Left upper arm - ant - moderate   Psoriatic arthritis (HCC)    Scliosis    arthritis -Psoriatic   Scoliosis     Family History  Problem Relation Age of Onset   Heart attack Father    Diabetes Maternal Grandmother    Hypertension Maternal Grandmother    Kidney disease Maternal Grandmother    Past Surgical History:  Procedure Laterality Date   BACK SURGERY  1994   for scoliosis   INSERTION OF MESH N/A 11/09/2016   Procedure: INSERTION OF MESH;  Surgeon: Griselda Miner, MD;  Location: [facility] OR;  Service: General;  Laterality: N/A;   KNEE SURGERY Left    menicus repair    MOLE REMOVAL  04/2021   TONSILLECTOMY     UMBILICAL HERNIA REPAIR N/A 11/09/2016   Procedure: HERNIA REPAIR UMBILICAL ADULT;  Surgeon: Griselda Miner, MD;  Location: [facility] OR;  Service: General;  Laterality: N/A;   Social History   Social History Narrative   Not on file   Immunization History  Administered Date(s) Administered   Influenza-Unspecified 11/09/2017   PFIZER(Purple  Top)SARS-COV-2 Vaccination 03/13/2019, 04/17/2019   Tdap 10/03/2015     Objective: Vital Signs: BP 134/87 (BP Location: Left Arm, Patient Position: Sitting, Cuff Size: Normal)   Pulse 90   Resp 16   Ht 5\' 6"  (1.676 m)   Wt 193 lb 3.2 oz (87.6 kg)   BMI 31.18 kg/m    Physical Exam Vitals and nursing note reviewed.  Constitutional:      Appearance: He is well-developed.  HENT:     Head: Normocephalic and atraumatic.  Eyes:     Conjunctiva/sclera: Conjunctivae normal.     Pupils: Pupils are equal, round, and reactive to light.  Cardiovascular:     Rate and Rhythm: Normal rate and regular rhythm.     Heart sounds: Normal heart sounds.  Pulmonary:     Effort: Pulmonary effort is normal.     Breath sounds: Normal breath sounds.  Abdominal:     General: Bowel sounds are normal.     Palpations: Abdomen is soft.  Musculoskeletal:     Cervical back: Normal range of motion and neck supple.  Skin:    General: Skin is warm and dry.     Capillary Refill: Capillary refill takes less than 2 seconds.     Comments: 1 patch of psoriasis behind left ear  Neurological:     Mental Status: He is alert and oriented to person, place, and time.  Psychiatric:        Behavior: Behavior normal.      Musculoskeletal Exam: C-spine has good range of motion.  Severe thoracolumbar scoliosis noted.  No SI joint tenderness upon palpation.  Shoulder joints, elbow joints, wrist joints, MCPs, PIPs, DIPs have good range of motion with no synovitis.  Complete fist formation bilaterally.  Hip joints have good range of motion with no groin pain.  Knee joints have good range of motion with no warmth or effusion.  Ankle joints have good range of motion with no tenderness or joint swelling.  No evidence of Achilles tendinitis or plantar fasciitis.  CDAI Exam: CDAI Score: -- Patient Global: --; Provider Global: -- Swollen: --; Tender: -- Joint Exam 09/09/2022   No joint exam has been documented for this visit    There is currently no information documented on the homunculus. Go to the Rheumatology activity and complete the homunculus joint exam.  Investigation: No additional findings.  Imaging: No results found.  Recent Labs: Lab Results  Component Value Date   WBC 7.8 07/01/2022   HGB 14.0 07/01/2022   PLT 296 07/01/2022   NA 139 07/01/2022   K 4.0 07/01/2022   CL 103 07/01/2022   CO2 27 07/01/2022   GLUCOSE 105 (H) 07/01/2022   BUN 10 07/01/2022   CREATININE 0.77 07/01/2022   BILITOT 0.7 07/01/2022   AST 17 07/01/2022   ALT 24 07/01/2022   PROT 7.1 07/01/2022   CALCIUM 9.0 07/01/2022   GFRAA 137 07/25/2020  QFTBGOLDPLUS NEGATIVE 03/16/2022    Speciality Comments: Treatment by Dr. Arlyce Harman response, Remicade-insurance issues Humira start-02/21  Procedures:  No procedures performed Allergies: Patient has no known allergies.   Assessment / Plan:     Visit Diagnoses: Psoriatic arthritis (HCC) - Diagnosed while he was in college.  Previous patient of Dr. Kathi Ludwig.  (Enbrel -inadequate response, Remicade discontinued due to insurance issues): He has no synovitis or dactylitis on examination today.  No SI joint tenderness upon palpation.  No evidence of Achilles tendinitis or plantar fasciitis.  He is clinically doing well on Humira 40 mg subcutaneous injections every 14 days and methotrexate 7 tablets by mouth once weekly.  He is tolerating combination therapy without any side effects or injection site reactions.  He is due for his next dose of Humira tomorrow so a sample was provided.  No medication changes will be made at this time.  He is advised to notify us if he develops signs or symptoms of a flare.  He will follow-up in the office in 5 months or sooner if needed.  High risk medication use - Humira 40 mg sq injection every 14 days, Methotrexate 2.5 mg 7 tablets once weekly, and folic acid 1 mg 2 tablets by mouth daily. CBC and CMP updated on 07/01/22.  Patient plans  on returning for lab work in August and every 3 months to monitor for drug toxicity.  Standing orders for CBC and CMP were updated today. TB gold negative on 03/16/22.   No recent or recurrent infections.  Discussed the importance of holding Humira and methotrexate if he develops signs or symptoms of an infection and to resume once the infection has completely cleared.  - Plan: CBC with Differential/Platelet, COMPLETE METABOLIC PANEL WITH GFR  Psoriasis: 1 small patch of psoriasis behind the left ear.  Patient declined refills of any topical agents at this time.  He was advised to notify us if he develops any new patches of psoriasis.  He will remain on methotrexate and Humira as prescribed.  Primary osteoarthritis of both hands: No tenderness or synovitis noted on examination today.  Complete fist formation bilaterally.  Primary osteoarthritis of both feet: Some PIP and DIP prominence noted in both feet consistent with osteoarthritis.  Chronic SI joint pain: No SI joint tenderness upon palpation.  History of thoracic spinal fusion - Congenital scoliosis, surgery at age of 89 he has chronic discomfort in his thoracic and lumbar region.  He is taking Flexeril 10 mg by bedtime for muscle spasms.  Refill Flexeril sent to the pharmacy today.  Essential hypertension: Blood pressure was 134/87 today in the office.  Orders: Orders Placed This Encounter  Procedures   CBC with Differential/Platelet   COMPLETE METABOLIC PANEL WITH GFR   Meds ordered this encounter  Medications   cyclobenzaprine (FLEXERIL) 10 MG tablet    Sig: Take 1 tablet (10 mg total) by mouth at bedtime.    Dispense:  30 tablet    Refill:  0    Follow-Up Instructions: Return in about 5 months (around 02/09/2023) for Psoriatic arthritis, Osteoarthritis.   Gearldine Bienenstock, PA-C  Note - This record has been created using Dragon software.  Chart creation errors have been sought, but may not always  have been located. Such  creation errors do not reflect on  the standard of medical care.

## 2022-09-01 ENCOUNTER — Other Ambulatory Visit: Payer: Self-pay | Admitting: Physician Assistant

## 2022-09-01 NOTE — Telephone Encounter (Signed)
Last Fill: 09/04/2021  Next Visit: 09/09/2022  Last Visit: 04/06/2022  Dx: Psoriatic arthritis   Current Dose per office note on 04/06/2022: folic acid 1 mg 2 tablets by mouth daily.   Okay to refill Folic Acid?

## 2022-09-02 ENCOUNTER — Other Ambulatory Visit (HOSPITAL_COMMUNITY): Payer: Self-pay

## 2022-09-02 ENCOUNTER — Other Ambulatory Visit: Payer: Self-pay | Admitting: Internal Medicine

## 2022-09-02 ENCOUNTER — Other Ambulatory Visit: Payer: Self-pay

## 2022-09-02 DIAGNOSIS — L4050 Arthropathic psoriasis, unspecified: Secondary | ICD-10-CM

## 2022-09-02 MED ORDER — HUMIRA (2 PEN) 40 MG/0.4ML ~~LOC~~ AJKT
AUTO-INJECTOR | SUBCUTANEOUS | 0 refills | Status: DC
Start: 2022-09-02 — End: 2022-12-09
  Filled 2022-09-02: qty 2, 28d supply, fill #0
  Filled 2022-10-06: qty 2, 28d supply, fill #1
  Filled 2022-11-09: qty 2, 28d supply, fill #2

## 2022-09-02 NOTE — Telephone Encounter (Signed)
Last Fill: 07/31/2022 (30 day supply)  Labs: 07/01/2022 CBC and CMP normal   TB Gold: 03/16/2022  TB Gold is negative    Next Visit: 09/09/2022   Last Visit: 04/06/2022   DX: Psoriatic arthritis   Current Dose per office note 04/06/2022: Humira 40 mg sq injection every 14 days   Okay to refill Humira?

## 2022-09-04 ENCOUNTER — Other Ambulatory Visit: Payer: Self-pay

## 2022-09-04 ENCOUNTER — Other Ambulatory Visit (HOSPITAL_COMMUNITY): Payer: Self-pay

## 2022-09-07 ENCOUNTER — Other Ambulatory Visit (HOSPITAL_COMMUNITY): Payer: Self-pay

## 2022-09-07 ENCOUNTER — Other Ambulatory Visit: Payer: Self-pay

## 2022-09-09 ENCOUNTER — Ambulatory Visit: Payer: Medicaid Other | Attending: Rheumatology | Admitting: Physician Assistant

## 2022-09-09 ENCOUNTER — Other Ambulatory Visit: Payer: Self-pay

## 2022-09-09 ENCOUNTER — Telehealth: Payer: Self-pay

## 2022-09-09 ENCOUNTER — Other Ambulatory Visit (HOSPITAL_COMMUNITY): Payer: Self-pay

## 2022-09-09 ENCOUNTER — Encounter: Payer: Self-pay | Admitting: Physician Assistant

## 2022-09-09 VITALS — BP 134/87 | HR 90 | Resp 16 | Ht 66.0 in | Wt 193.2 lb

## 2022-09-09 DIAGNOSIS — L409 Psoriasis, unspecified: Secondary | ICD-10-CM

## 2022-09-09 DIAGNOSIS — M19072 Primary osteoarthritis, left ankle and foot: Secondary | ICD-10-CM

## 2022-09-09 DIAGNOSIS — Z79899 Other long term (current) drug therapy: Secondary | ICD-10-CM | POA: Diagnosis not present

## 2022-09-09 DIAGNOSIS — M19071 Primary osteoarthritis, right ankle and foot: Secondary | ICD-10-CM

## 2022-09-09 DIAGNOSIS — Z981 Arthrodesis status: Secondary | ICD-10-CM

## 2022-09-09 DIAGNOSIS — M19042 Primary osteoarthritis, left hand: Secondary | ICD-10-CM

## 2022-09-09 DIAGNOSIS — I10 Essential (primary) hypertension: Secondary | ICD-10-CM

## 2022-09-09 DIAGNOSIS — M19041 Primary osteoarthritis, right hand: Secondary | ICD-10-CM | POA: Diagnosis not present

## 2022-09-09 DIAGNOSIS — L4050 Arthropathic psoriasis, unspecified: Secondary | ICD-10-CM | POA: Diagnosis not present

## 2022-09-09 DIAGNOSIS — M533 Sacrococcygeal disorders, not elsewhere classified: Secondary | ICD-10-CM

## 2022-09-09 DIAGNOSIS — G8929 Other chronic pain: Secondary | ICD-10-CM

## 2022-09-09 MED ORDER — CYCLOBENZAPRINE HCL 10 MG PO TABS: 10.0000 mg | ORAL_TABLET | Freq: Every day | ORAL | 0 refills | Status: DC

## 2022-09-09 NOTE — Telephone Encounter (Signed)
Received notification from [facility] regarding a prior authorization for HUMIRA. Authorization has been APPROVED from 09/09/2022 to 09/09/2023. Approval letter sent to scan center.  Authorization # [id number]

## 2022-09-09 NOTE — Telephone Encounter (Signed)
Medication Samples have been provided to the patient.  Drug name: Humira      Strength: 40mg /0.37mL          Qty: 1  LOT: [id number]  Exp.Date: 07/10/2023  Dosing instructions: Inject 40mg  into the skin every 14 days.

## 2022-09-09 NOTE — Telephone Encounter (Signed)
Received notification from [facility] that pt needs a new PA.  Submitted an URGENT Prior Authorization request to [facility] MEDICAID for HUMIRA via CoverMyMeds. Will update once we receive a response.  Key: [id number]

## 2022-09-09 NOTE — Patient Instructions (Signed)
Standing Labs We placed an order today for your standing lab work.   Please have your standing labs drawn at the end of October and every 3 months   Please have your labs drawn 2 weeks prior to your appointment so that the provider can discuss your lab results at your appointment, if possible.  Please note that you may see your imaging and lab results in MyChart before we have reviewed them. We will contact you once all results are reviewed. Please allow our office up to 72 hours to thoroughly review all of the results before contacting the office for clarification of your results.  WALK-IN LAB HOURS  Monday through Thursday from 8:00 am -12:30 pm and 1:00 pm-5:00 pm and Friday from 8:00 am-12:00 pm.  Patients with office visits requiring labs will be seen before walk-in labs.  You may encounter longer than normal wait times. Please allow additional time. Wait times may be shorter on  Monday and Thursday afternoons.  We do not book appointments for walk-in labs. We appreciate your patience and understanding with our staff.   Labs are drawn by [facility]. Please bring your co-pay at the time of your lab draw.  You may receive a bill from [facility] for your lab work.  Please note if you are on Hydroxychloroquine and and an order has been placed for a Hydroxychloroquine level,  you will need to have it drawn 4 hours or more after your last dose.  If you wish to have your labs drawn at another location, please call the office 24 hours in advance so we can fax the orders.  The office is located at [address], [address], [place], [place] [address]   If you have any questions regarding directions or hours of operation,  please call [phone].   As a reminder, please drink plenty of water prior to coming for your lab work. Thanks!

## 2022-09-15 ENCOUNTER — Other Ambulatory Visit: Payer: Self-pay

## 2022-09-15 ENCOUNTER — Other Ambulatory Visit: Payer: Self-pay | Admitting: *Deleted

## 2022-09-15 MED ORDER — ATENOLOL 25 MG PO TABS: 25.0000 mg | ORAL_TABLET | Freq: Every day | ORAL | 0 refills | Status: DC

## 2022-09-15 NOTE — Telephone Encounter (Signed)
This note is not being shared with the patient for the following reason: To respect privacy (The patient or proxy has requested that the information not be shared).  I called patient.

## 2022-09-15 NOTE — Telephone Encounter (Addendum)
I called patient, patient verbalized understanding. 

## 2022-09-15 NOTE — Telephone Encounter (Signed)
Last Fill: 06/2022 by PCP, PCP will not fill meds because patient hasn't been seen in years, patient requesting our office take over RX.  Next Visit: 02/17/2023  Last Visit: 09/09/2022  Dx: Psoriatic arthritis   Current Dose per office note on 09/09/2022: not addressed  Okay to refill Atenolol?

## 2022-09-15 NOTE — Telephone Encounter (Signed)
His PCP will need to manage his hypertension.  Please advise patient to schedule a follow up visit with his PCP.   Ok to provide 30-day supply of atenolol until he has seen his PCP.

## 2022-09-15 NOTE — Telephone Encounter (Deleted)
This note is not being shared with the patient for the following reason: I called patient.

## 2022-09-17 ENCOUNTER — Other Ambulatory Visit: Payer: Self-pay | Admitting: Physician Assistant

## 2022-09-17 NOTE — Telephone Encounter (Signed)
Last Fill: 07/14/2022  Labs: 07/01/2022 CBC and CMP normal.   Next Visit: 02/17/2023   Last Visit: 09/09/2022  DX: Psoriatic arthritis   Current Dose per office note 09/09/2022: Methotrexate 2.5 mg 7 tablets once weekly   Okay to refill Methotrexate?

## 2022-09-30 ENCOUNTER — Other Ambulatory Visit: Payer: Self-pay | Admitting: *Deleted

## 2022-09-30 DIAGNOSIS — Z79899 Other long term (current) drug therapy: Secondary | ICD-10-CM

## 2022-10-01 LAB — COMPLETE METABOLIC PANEL WITH GFR
AG Ratio: 1.4 (calc) (ref 1.0–2.5)
ALT: 28 U/L (ref 9–46)
AST: 19 U/L (ref 10–40)
Albumin: 4.6 g/dL (ref 3.6–5.1)
Alkaline phosphatase (APISO): 87 U/L (ref 36–130)
BUN: 11 mg/dL (ref 7–25)
CO2: 27 mmol/L (ref 20–32)
Calcium: 9.8 mg/dL (ref 8.6–10.3)
Chloride: 101 mmol/L (ref 98–110)
Creat: 0.81 mg/dL (ref 0.60–1.26)
Globulin: 3.3 g/dL (ref 1.9–3.7)
Glucose, Bld: 94 mg/dL (ref 65–99)
Potassium: 4.6 mmol/L (ref 3.5–5.3)
Sodium: 137 mmol/L (ref 135–146)
Total Bilirubin: 0.7 mg/dL (ref 0.2–1.2)
Total Protein: 7.9 g/dL (ref 6.1–8.1)
eGFR: 116 mL/min/{1.73_m2} (ref >=60)

## 2022-10-01 LAB — CBC WITH DIFFERENTIAL/PLATELET
Absolute Monocytes: 851 {cells}/uL (ref 200–950)
Basophils Absolute: 49 {cells}/uL (ref 0–200)
Basophils Relative: 0.6 %
Eosinophils Absolute: 251 {cells}/uL (ref 15–500)
Eosinophils Relative: 3.1 %
HCT: 44.8 % (ref 38.5–50.0)
Hemoglobin: 15.3 g/dL (ref 13.2–17.1)
Lymphs Abs: 3200 {cells}/uL (ref 850–3900)
MCH: 31.9 pg (ref 27.0–33.0)
MCHC: 34.2 g/dL (ref 32.0–36.0)
MCV: 93.3 fL (ref 80.0–100.0)
MPV: 9.8 fL (ref 7.5–12.5)
Monocytes Relative: 10.5 %
Neutro Abs: 3750 {cells}/uL (ref 1500–7800)
Neutrophils Relative %: 46.3 %
Platelets: 304 10*3/uL (ref 140–400)
RBC: 4.8 10*6/uL (ref 4.20–5.80)
RDW: 12.6 % (ref 11.0–15.0)
Total Lymphocyte: 39.5 %
WBC: 8.1 10*3/uL (ref 3.8–10.8)

## 2022-10-01 NOTE — Progress Notes (Signed)
CBC and CMP WNL

## 2022-10-06 ENCOUNTER — Other Ambulatory Visit (HOSPITAL_COMMUNITY): Payer: Self-pay

## 2022-10-07 ENCOUNTER — Other Ambulatory Visit: Payer: Self-pay | Admitting: Physician Assistant

## 2022-10-07 NOTE — Telephone Encounter (Signed)
Last Fill: 09/09/2022  Next Visit: 02/17/2023  Last Visit: 09/09/2022  Dx: History of thoracic spinal fusion   Current Dose per office note on 09/09/2022: Flexeril 10 mg by bedtime for muscle spasms   Okay to refill Flexeril?

## 2022-10-15 ENCOUNTER — Other Ambulatory Visit (HOSPITAL_COMMUNITY): Payer: Self-pay

## 2022-10-20 ENCOUNTER — Other Ambulatory Visit (HOSPITAL_COMMUNITY): Payer: Self-pay

## 2022-11-02 ENCOUNTER — Other Ambulatory Visit: Payer: Self-pay | Admitting: Physician Assistant

## 2022-11-02 NOTE — Telephone Encounter (Signed)
Last Fill: 10/07/2022   Next Visit: 02/17/2023   Last Visit: 09/09/2022   Dx: History of thoracic spinal fusion    Current Dose per office note on 09/09/2022: Flexeril 10 mg by bedtime for muscle spasms    Okay to refill Flexeril?

## 2022-11-03 ENCOUNTER — Other Ambulatory Visit: Payer: Self-pay

## 2022-11-09 ENCOUNTER — Other Ambulatory Visit: Payer: Self-pay

## 2022-11-09 NOTE — Progress Notes (Signed)
Specialty Pharmacy Refill Coordination Note  Joshua English is a 37 y.o. male contacted today regarding refills of specialty medication(s) Adalimumab .  Patient requested Delivery  on 11/13/22  to verified address [address] [place] [address]   Medication will be filled on 11/12/22.

## 2022-11-12 ENCOUNTER — Other Ambulatory Visit: Payer: Self-pay

## 2022-12-09 ENCOUNTER — Other Ambulatory Visit: Payer: Self-pay

## 2022-12-09 ENCOUNTER — Other Ambulatory Visit: Payer: Self-pay | Admitting: Physician Assistant

## 2022-12-09 DIAGNOSIS — L4050 Arthropathic psoriasis, unspecified: Secondary | ICD-10-CM

## 2022-12-09 MED ORDER — HUMIRA (2 PEN) 40 MG/0.4ML ~~LOC~~ AJKT
AUTO-INJECTOR | SUBCUTANEOUS | 0 refills | Status: DC
Start: 2022-12-09 — End: 2023-03-02
  Filled 2022-12-09: qty 2, 28d supply, fill #0
  Filled 2023-01-04: qty 2, 28d supply, fill #1
  Filled 2023-02-01: qty 2, 28d supply, fill #2

## 2022-12-09 NOTE — Progress Notes (Signed)
Specialty Pharmacy Refill Coordination Note  Joshua English is a 37 y.o. male contacted today regarding refills of specialty medication(s) Adalimumab   Patient requested Delivery   Delivery date: 12/15/22   Verified address: [address] [place] [address]   Medication will be filled on 12/14/22.     Pending refill request.

## 2022-12-09 NOTE — Telephone Encounter (Signed)
Last Fill: 09/02/2022  Labs: 09/30/2022 CBC and CMP WNL   TB Gold: 03/16/2022 Neg    Next Visit: 02/17/2023  Last Visit: 09/09/2022  DX: Psoriatic arthritis   Current Dose per office note 09/09/2022: Humira 40 mg sq injection every 14 days   Okay to refill Humira?

## 2022-12-14 ENCOUNTER — Other Ambulatory Visit: Payer: Self-pay | Admitting: Physician Assistant

## 2022-12-14 ENCOUNTER — Other Ambulatory Visit: Payer: Self-pay

## 2022-12-14 NOTE — Telephone Encounter (Signed)
Last Fill: 11/02/2022   Next Visit: 02/17/2023   Last Visit: 09/09/2022   Dx: History of thoracic spinal fusion    Current Dose per office note on 09/09/2022: Flexeril 10 mg by bedtime for muscle spasms    Okay to refill Flexeril?

## 2022-12-29 ENCOUNTER — Other Ambulatory Visit: Payer: Self-pay | Admitting: *Deleted

## 2022-12-29 DIAGNOSIS — Z79899 Other long term (current) drug therapy: Secondary | ICD-10-CM

## 2022-12-30 LAB — COMPLETE METABOLIC PANEL WITH GFR
AG Ratio: 1.5 (calc) (ref 1.0–2.5)
ALT: 34 U/L (ref 9–46)
AST: 22 U/L (ref 10–40)
Albumin: 4.5 g/dL (ref 3.6–5.1)
Alkaline phosphatase (APISO): 98 U/L (ref 36–130)
BUN: 10 mg/dL (ref 7–25)
CO2: 29 mmol/L (ref 20–32)
Calcium: 9.5 mg/dL (ref 8.6–10.3)
Chloride: 101 mmol/L (ref 98–110)
Creat: 0.84 mg/dL (ref 0.60–1.26)
Globulin: 3.1 g/dL (ref 1.9–3.7)
Glucose, Bld: 116 mg/dL — ABNORMAL HIGH (ref 65–99)
Potassium: 4.3 mmol/L (ref 3.5–5.3)
Sodium: 137 mmol/L (ref 135–146)
Total Bilirubin: 0.8 mg/dL (ref 0.2–1.2)
Total Protein: 7.6 g/dL (ref 6.1–8.1)
eGFR: 115 mL/min/{1.73_m2} (ref >=60)

## 2022-12-30 LAB — CBC WITH DIFFERENTIAL/PLATELET
Absolute Lymphocytes: 3142 {cells}/uL (ref 850–3900)
Absolute Monocytes: 660 {cells}/uL (ref 200–950)
Basophils Absolute: 62 {cells}/uL (ref 0–200)
Basophils Relative: 0.7 %
Eosinophils Absolute: 141 {cells}/uL (ref 15–500)
Eosinophils Relative: 1.6 %
HCT: 44.4 % (ref 38.5–50.0)
Hemoglobin: 15.1 g/dL (ref 13.2–17.1)
MCH: 32.3 pg (ref 27.0–33.0)
MCHC: 34 g/dL (ref 32.0–36.0)
MCV: 95.1 fL (ref 80.0–100.0)
MPV: 9.9 fL (ref 7.5–12.5)
Monocytes Relative: 7.5 %
Neutro Abs: 4796 {cells}/uL (ref 1500–7800)
Neutrophils Relative %: 54.5 %
Platelets: 285 10*3/uL (ref 140–400)
RBC: 4.67 10*6/uL (ref 4.20–5.80)
RDW: 12.6 % (ref 11.0–15.0)
Total Lymphocyte: 35.7 %
WBC: 8.8 10*3/uL (ref 3.8–10.8)

## 2022-12-30 NOTE — Progress Notes (Signed)
Glucose is 116. Rest of CMP WNL.  CBC WNL.

## 2023-01-01 ENCOUNTER — Other Ambulatory Visit: Payer: Self-pay | Admitting: *Deleted

## 2023-01-01 MED ORDER — METHOTREXATE SODIUM 2.5 MG PO TABS: 17.5000 mg | ORAL_TABLET | ORAL | 2 refills | Status: DC

## 2023-01-01 NOTE — Telephone Encounter (Signed)
Refill request received via fax from [facility]  for Methotrexate  Last Fill: 09/17/2022  Labs: 12/29/2022 Glucose is 116. Rest of CMP WNL. CBC WNL  Next Visit: 02/17/2023  Last Visit: 09/09/2022  DX: Psoriatic arthritis   Current Dose per office note 09/09/2022: Methotrexate 2.5 mg 7 tablets once weekly   Okay to refill Methotrexate?

## 2023-01-04 ENCOUNTER — Other Ambulatory Visit: Payer: Self-pay

## 2023-01-04 NOTE — Progress Notes (Signed)
Specialty Pharmacy Refill Coordination Note  Joshua English is a 37 y.o. male contacted today regarding refills of specialty medication(s) Adalimumab   Patient requested Delivery   Delivery date: 01/06/23   Verified address: [address] [place] [address]   Medication will be filled on 01/05/23 for 01/14/23 injection.

## 2023-01-05 ENCOUNTER — Other Ambulatory Visit: Payer: Self-pay

## 2023-01-05 ENCOUNTER — Other Ambulatory Visit (HOSPITAL_COMMUNITY): Payer: Self-pay

## 2023-01-25 ENCOUNTER — Other Ambulatory Visit: Payer: Self-pay

## 2023-01-25 MED ORDER — CYCLOBENZAPRINE HCL 10 MG PO TABS: 10.0000 mg | ORAL_TABLET | Freq: Every day | ORAL | 0 refills | Status: DC

## 2023-01-25 NOTE — Telephone Encounter (Signed)
Patient contacted the office and requests a refill of Flexeril sent to [facility] on [address].   Last Fill: 12/14/2022  Next Visit: 02/17/2023  Last Visit: 09/09/2022  Dx: History of thoracic spinal fusion   Current Dose per office note on 09/09/2022: Flexeril 10 mg by bedtime   Okay to refill Flexeril?

## 2023-02-01 ENCOUNTER — Other Ambulatory Visit: Payer: Self-pay

## 2023-02-01 NOTE — Progress Notes (Signed)
Specialty Pharmacy Refill Coordination Note  Joshua English is a 37 y.o. male contacted today regarding refills of specialty medication(s) Adalimumab (Humira (2 Pen))   Patient requested Delivery   Delivery date: 02/05/23   Verified address: [address]  [place]  [address]   Medication will be filled on 02/04/23.

## 2023-02-04 ENCOUNTER — Other Ambulatory Visit: Payer: Self-pay

## 2023-02-05 NOTE — Progress Notes (Signed)
 Office Visit Note  Patient: Joshua English             Date of Birth: February 16, 1985           MRN: [id number]             PCP: Barbra Odor, NP Referring: Barbra Odor, NP Visit Date: 02/17/2023 Occupation: @GUAROCC @  Subjective:  Medication monitoring  History of Present Illness: Joshua English is a 37 y.o. male with history of psoriatic arthritis and osteoarthritis. Patient remains on  Humira  40 mg sq injection every 14 days, Methotrexate  2.5 mg 7 tablets once weekly, and folic acid  1 mg 2 tablets by mouth daily.  He is tolerating combination therapy without any side effects and has not had any recent gaps in therapy.  He denies any recent or recurrent infections.  He denies any signs or symptoms of a psoriatic arthritis flare.  He denies any active psoriasis at this time.  He denies any Achilles tendinitis or plantar fasciitis.  He continues to have chronic pain in his thoracic and lumbar spine secondary to scoliosis.  He experiences discomfort in his lower back at night and has muscle spasms intermittently.  He is been taking Flexeril  10 mg at bedtime as needed for muscle spasms which has been helpful. He denies any new medical conditions.    Activities of Daily Living:  Patient reports morning stiffness for 1 hour.   Patient Reports nocturnal pain.  Difficulty dressing/grooming: Denies Difficulty climbing stairs: Denies Difficulty getting out of chair: Denies Difficulty using hands for taps, buttons, cutlery, and/or writing: Denies  Review of Systems  Constitutional:  Negative for fatigue.  HENT:  Negative for mouth sores and mouth dryness.   Eyes:  Negative for dryness.  Respiratory:  Negative for shortness of breath.   Cardiovascular:  Negative for chest pain and palpitations.  Gastrointestinal:  Negative for blood in stool, constipation and diarrhea.  Endocrine: Negative for increased urination.  Genitourinary:  Negative for involuntary urination.  Musculoskeletal:   Positive for joint pain, joint pain and morning stiffness. Negative for gait problem, joint swelling, myalgias, muscle weakness, muscle tenderness and myalgias.  Skin:  Negative for color change, rash, hair loss and sensitivity to sunlight.  Allergic/Immunologic: Negative for susceptible to infections.  Neurological:  Negative for dizziness and headaches.  Hematological:  Negative for swollen glands.  Psychiatric/Behavioral:  Positive for sleep disturbance. Negative for depressed mood. The patient is not nervous/anxious.     PMFS History:  Patient Active Problem List   Diagnosis Date Noted   Psoriatic arthritis (HCC) 02/21/2019   Other psoriasis 02/21/2019   High risk medication use 02/21/2019    Past Medical History:  Diagnosis Date   Dysplastic nevus 05/14/2021   R upper back - moderate   Dysplastic nevus 05/20/2022   Left upper arm - ant - moderate   Psoriatic arthritis (HCC)    Scliosis    arthritis -Psoriatic   Scoliosis     Family History  Problem Relation Age of Onset   Heart attack Father    Diabetes Maternal Grandmother    Hypertension Maternal Grandmother    Kidney disease Maternal Grandmother    Past Surgical History:  Procedure Laterality Date   BACK SURGERY  1994   for scoliosis   INSERTION OF MESH N/A 11/09/2016   Procedure: INSERTION OF MESH;  Surgeon: Curvin Deward MOULD, MD;  Location: [facility] OR;  Service: General;  Laterality: N/A;   KNEE SURGERY Left    menicus repair  MOLE REMOVAL  04/2021   TONSILLECTOMY     UMBILICAL HERNIA REPAIR N/A 11/09/2016   Procedure: HERNIA REPAIR UMBILICAL ADULT;  Surgeon: Curvin Deward MOULD, MD;  Location: [facility] OR;  Service: General;  Laterality: N/A;   Social History   Social History Narrative   Not on file   Immunization History  Administered Date(s) Administered   Influenza-Unspecified 11/09/2017   PFIZER(Purple Top)SARS-COV-2 Vaccination 03/13/2019, 04/17/2019   Tdap 10/03/2015     Objective: Vital Signs: BP (!) 145/91  (BP Location: Left Arm, Patient Position: Sitting, Cuff Size: Normal)   Pulse 90   Resp 14   Ht 5' 6 (1.676 m)   Wt 203 lb (92.1 kg)   BMI 32.77 kg/m    Physical Exam Vitals and nursing note reviewed.  Constitutional:      Appearance: He is well-developed.  HENT:     Head: Normocephalic and atraumatic.  Eyes:     Conjunctiva/sclera: Conjunctivae normal.     Pupils: Pupils are equal, round, and reactive to light.  Cardiovascular:     Rate and Rhythm: Normal rate and regular rhythm.     Heart sounds: Normal heart sounds.  Pulmonary:     Effort: Pulmonary effort is normal.     Breath sounds: Normal breath sounds.  Abdominal:     General: Bowel sounds are normal.     Palpations: Abdomen is soft.  Musculoskeletal:     Cervical back: Normal range of motion and neck supple.  Skin:    General: Skin is warm and dry.     Capillary Refill: Capillary refill takes less than 2 seconds.  Neurological:     Mental Status: He is alert and oriented to person, place, and time.  Psychiatric:        Behavior: Behavior normal.      Musculoskeletal Exam: C-spine has good range of motion.  Severe thoracolumbar scoliosis noted.  Shoulder joints, elbow joints, wrist joints, MCPs, PIPs, DIPs have good range of motion with no synovitis.  Complete fist formation bilaterally.  Knee joints have good range of motion no warmth or effusion.  Ankle joints have good range of motion with no tenderness or joint swelling.  No evidence of Achilles tendinitis or plantar fasciitis.  CDAI Exam: CDAI Score: -- Patient Global: --; Provider Global: -- Swollen: --; Tender: -- Joint Exam 02/17/2023   No joint exam has been documented for this visit   There is currently no information documented on the homunculus. Go to the Rheumatology activity and complete the homunculus joint exam.  Investigation: No additional findings.  Imaging: No results found.  Recent Labs: Lab Results  Component Value Date   WBC  8.8 12/29/2022   HGB 15.1 12/29/2022   PLT 285 12/29/2022   NA 137 12/29/2022   K 4.3 12/29/2022   CL 101 12/29/2022   CO2 29 12/29/2022   GLUCOSE 116 (H) 12/29/2022   BUN 10 12/29/2022   CREATININE 0.84 12/29/2022   BILITOT 0.8 12/29/2022   AST 22 12/29/2022   ALT 34 12/29/2022   PROT 7.6 12/29/2022   CALCIUM 9.5 12/29/2022   GFRAA 137 07/25/2020   QFTBGOLDPLUS NEGATIVE 03/16/2022    Speciality Comments: Treatment by Dr. Leni Felts response, Remicade -insurance issues Humira  start-02/21  Procedures:  No procedures performed Allergies: Patient has no known allergies.   Assessment / Plan:     Visit Diagnoses: Psoriatic arthritis (HCC) - Diagnosed while he was in college.  Previous patient of Dr. Leni.  (Enbrel -inadequate response,  Remicade  discontinued due to insurance issues): He has no synovitis or dactylitis on examination today.  He has not had any signs or symptoms of a psoriatic arthritis flare.  No active psoriasis at this time.  No evidence of Achilles tendinitis or plantar fasciitis.  No signs or symptoms of uveitis.  He has clinically been doing well on Humira  40 mg subcutaneous injections every 14 days, methotrexate  7 tablets by mouth once weekly, and folic acid  2 mg daily.  He is tolerating combination therapy without any side effects and has not had any recent gaps in therapy.  No recent or recurrent infections. No medication changes will be made at this time. He was advised to notify us  if he develops signs or symptoms of a flare.  He will follow-up in the office in 5 months or sooner if needed.  Psoriasis: He has no active psoriasis at this time.  High risk medication use - Humira  40 mg sq injection every 14 days, Methotrexate  2.5 mg 7 tablets once weekly, and folic acid  1 mg 2 tablets by mouth daily.  CBC and CMP updated on 12/29/22.  His next lab work will be due in February and every 3 months to monitor for drug toxicity.  Future orders for CBC and CMP  were placed today. TB gold negative on 03/16/22.  Future order for TB gold placed today. No recent or recurrent infections.  Discussed the importance of holding humira  and methotrexate  if he develops signs or symptoms of an infection and to resume once the infection has completely cleared. - Plan: CBC with Differential/Platelet, COMPLETE METABOLIC PANEL WITH GFR, QuantiFERON-TB Gold Plus  Screening for tuberculosis: Future order for TB gold placed today.  Primary osteoarthritis of both hands: He is not experiencing any discomfort or stiffness in his hands at this time.  No synovitis or dactylitis noted.  Complete fist formation noted bilaterally.  Primary osteoarthritis of both feet: He is not experiencing any increased discomfort in his feet at this time.  He is good range of motion of both ankle joints with no tenderness or joint swelling.  No evidence of Achilles tendinitis or plantar fasciitis.  Chronic SI joint pain: He continues to experience chronic pain in the thoracic and lumbar spine.  He experiences intermittent discomfort in his SI joints with certain positions at night. His discomfort is alleviated by taking flexeril  at bedtime.   History of thoracic spinal fusion - Congenital scoliosis, surgery at age of 80 he has chronic discomfort in his thoracic and lumbar region.  He is taking Flexeril  10 mg by bedtime--a refill sent to the pharmacy today.  Essential hypertension: Pressure was 145/91 today in the office.  Patient was advised to monitor blood pressure closely to reach out to PCP if remains elevated.  Orders: Orders Placed This Encounter  Procedures   CBC with Differential/Platelet   COMPLETE METABOLIC PANEL WITH GFR   QuantiFERON-TB Gold Plus   Meds ordered this encounter  Medications   cyclobenzaprine  (FLEXERIL ) 10 MG tablet    Sig: Take 1 tablet (10 mg total) by mouth at bedtime.    Dispense:  30 tablet    Refill:  0     Follow-Up Instructions: Return in about 5  months (around 07/18/2023) for Psoriatic arthritis, Osteoarthritis.   Waddell CHRISTELLA Craze, PA-C  Note - This record has been created using Dragon software.  Chart creation errors have been sought, but may not always  have been located. Such creation errors do not reflect on  the standard of medical care.

## 2023-02-17 ENCOUNTER — Encounter: Payer: Self-pay | Admitting: Physician Assistant

## 2023-02-17 ENCOUNTER — Ambulatory Visit: Payer: Medicaid Other | Attending: Physician Assistant | Admitting: Physician Assistant

## 2023-02-17 VITALS — BP 145/91 | HR 90 | Resp 14 | Ht 66.0 in | Wt 203.0 lb

## 2023-02-17 DIAGNOSIS — L4050 Arthropathic psoriasis, unspecified: Secondary | ICD-10-CM | POA: Diagnosis not present

## 2023-02-17 DIAGNOSIS — M19041 Primary osteoarthritis, right hand: Secondary | ICD-10-CM | POA: Diagnosis not present

## 2023-02-17 DIAGNOSIS — Z79899 Other long term (current) drug therapy: Secondary | ICD-10-CM

## 2023-02-17 DIAGNOSIS — I10 Essential (primary) hypertension: Secondary | ICD-10-CM

## 2023-02-17 DIAGNOSIS — M19072 Primary osteoarthritis, left ankle and foot: Secondary | ICD-10-CM

## 2023-02-17 DIAGNOSIS — M19071 Primary osteoarthritis, right ankle and foot: Secondary | ICD-10-CM

## 2023-02-17 DIAGNOSIS — L409 Psoriasis, unspecified: Secondary | ICD-10-CM | POA: Diagnosis not present

## 2023-02-17 DIAGNOSIS — G8929 Other chronic pain: Secondary | ICD-10-CM

## 2023-02-17 DIAGNOSIS — M533 Sacrococcygeal disorders, not elsewhere classified: Secondary | ICD-10-CM

## 2023-02-17 DIAGNOSIS — M19042 Primary osteoarthritis, left hand: Secondary | ICD-10-CM

## 2023-02-17 DIAGNOSIS — Z981 Arthrodesis status: Secondary | ICD-10-CM

## 2023-02-17 DIAGNOSIS — Z111 Encounter for screening for respiratory tuberculosis: Secondary | ICD-10-CM

## 2023-02-17 MED ORDER — CYCLOBENZAPRINE HCL 10 MG PO TABS: 10.0000 mg | ORAL_TABLET | Freq: Every day | ORAL | 0 refills | Status: DC

## 2023-02-17 NOTE — Patient Instructions (Signed)
 Standing Labs We placed an order today for your standing lab work.   Please have your standing labs drawn in February and every 3 months   Please have your labs drawn 2 weeks prior to your appointment so that the provider can discuss your lab results at your appointment, if possible.  Please note that you may see your imaging and lab results in MyChart before we have reviewed them. We will contact you once all results are reviewed. Please allow our office up to 72 hours to thoroughly review all of the results before contacting the office for clarification of your results.  WALK-IN LAB HOURS  Monday through Thursday from 8:00 am -12:30 pm and 1:00 pm-5:00 pm and Friday from 8:00 am-12:00 pm.  Patients with office visits requiring labs will be seen before walk-in labs.  You may encounter longer than normal wait times. Please allow additional time. Wait times may be shorter on  Monday and Thursday afternoons.  We do not book appointments for walk-in labs. We appreciate your patience and understanding with our staff.   Labs are drawn by [facility]. Please bring your co-pay at the time of your lab draw.  You may receive a bill from [facility] for your lab work.  Please note if you are on Hydroxychloroquine and and an order has been placed for a Hydroxychloroquine level,  you will need to have it drawn 4 hours or more after your last dose.  If you wish to have your labs drawn at another location, please call the office 24 hours in advance so we can fax the orders.  The office is located at [address], [address], [place], [place] [address]   If you have any questions regarding directions or hours of operation,  please call [phone].   As a reminder, please drink plenty of water prior to coming for your lab work. Thanks!

## 2023-03-02 ENCOUNTER — Other Ambulatory Visit: Payer: Self-pay

## 2023-03-02 ENCOUNTER — Other Ambulatory Visit: Payer: Self-pay | Admitting: Physician Assistant

## 2023-03-02 DIAGNOSIS — L4050 Arthropathic psoriasis, unspecified: Secondary | ICD-10-CM

## 2023-03-02 MED ORDER — HUMIRA (2 PEN) 40 MG/0.4ML ~~LOC~~ AJKT
AUTO-INJECTOR | SUBCUTANEOUS | 0 refills | Status: DC
  Filled 2023-03-02: qty 2, 28d supply, fill #0
  Filled 2023-03-29: qty 2, 28d supply, fill #1
  Filled 2023-04-28: qty 2, 28d supply, fill #2

## 2023-03-02 NOTE — Progress Notes (Signed)
Specialty Pharmacy Refill Coordination Note  Joshua English is a 38 y.o. male contacted today regarding refills of specialty medication(s) Adalimumab (Humira (2 Pen))   Patient requested Delivery   Delivery date: 03/05/23   Verified address: [address]  [name] [address]   Medication will be filled on 03/04/23 pending a refill request.

## 2023-03-02 NOTE — Telephone Encounter (Signed)
Last Fill: 12/09/2022  Labs: 12/29/2022 Glucose is 116. Rest of CMP WNL. CBC WNL.  TB Gold: 03/16/2022 Neg    Next Visit: 07/19/2023  Last Visit: 02/17/2023  DX: Psoriatic arthritis   Current Dose per office note 02/17/2023: Humira 40 mg sq injection every 14 days   Okay to refill Humira?

## 2023-03-04 ENCOUNTER — Other Ambulatory Visit: Payer: Self-pay

## 2023-03-17 ENCOUNTER — Other Ambulatory Visit: Payer: Self-pay | Admitting: Rheumatology

## 2023-03-17 NOTE — Telephone Encounter (Signed)
 Last Fill: 01/01/2023  Labs: 12/29/2022 Glucose is 116. Rest of CMP WNL. CBC WNL.  Next Visit: 07/19/2023  Last Visit: 02/17/2023  DX: Psoriatic arthritis   Current Dose per office note 02/17/2023: Methotrexate  2.5 mg 7 tablets once weekly   Okay to refill Methotrexate ?

## 2023-03-26 ENCOUNTER — Other Ambulatory Visit: Payer: Self-pay | Admitting: Physician Assistant

## 2023-03-26 NOTE — Telephone Encounter (Signed)
Last Fill: 02/17/2023  Next Visit: 07/19/2023  Last Visit: 02/17/2023  Dx: History of thoracic spinal fusion   Current Dose per office note on 02/17/2023: Flexeril 10 mg by bedtime   Okay to refill Flexeril?

## 2023-03-29 ENCOUNTER — Other Ambulatory Visit (HOSPITAL_COMMUNITY): Payer: Self-pay

## 2023-03-29 NOTE — Progress Notes (Signed)
Specialty Pharmacy Ongoing Clinical Assessment Note  Joshua English is a 38 y.o. male who is being followed by the specialty pharmacy service for RxSp Psoriatic Arthritis   Patient's specialty medication(s) reviewed today: Adalimumab (Humira (2 Pen))   Missed doses in the last 4 weeks: 0   Patient/Caregiver did not have any additional questions or concerns.   Therapeutic benefit summary: Patient is achieving benefit   Adverse events/side effects summary: No adverse events/side effects   Patient's therapy is appropriate to: Continue    Goals Addressed             This Visit's Progress    Minimize recurrence of flares       Patient is on track. Patient will maintain adherence.  Patient reports that he is well-controlled on therapy at this time with no recent flares.          Follow up:  6 months  Servando Snare Specialty Pharmacist

## 2023-03-29 NOTE — Progress Notes (Signed)
Specialty Pharmacy Refill Coordination Note  Joshua English is a 38 y.o. male contacted today regarding refills of specialty medication(s) Adalimumab (Humira (2 Pen))   Patient requested Delivery   Delivery date: 04/06/23   Verified address: [address]  [name] [address]   Medication will be filled on 04/05/23.

## 2023-04-05 ENCOUNTER — Other Ambulatory Visit: Payer: Self-pay

## 2023-04-08 ENCOUNTER — Other Ambulatory Visit: Payer: Self-pay | Admitting: *Deleted

## 2023-04-08 DIAGNOSIS — Z79899 Other long term (current) drug therapy: Secondary | ICD-10-CM

## 2023-04-09 ENCOUNTER — Other Ambulatory Visit: Payer: Self-pay | Admitting: *Deleted

## 2023-04-09 DIAGNOSIS — Z79899 Other long term (current) drug therapy: Secondary | ICD-10-CM

## 2023-04-09 MED ORDER — METHOTREXATE SODIUM 2.5 MG PO TABS: 15.0000 mg | ORAL_TABLET | ORAL | Status: DC

## 2023-04-09 NOTE — Telephone Encounter (Signed)
-----   Message from Gearldine Bienenstock sent at 04/09/2023  7:16 AM EST ----- Glucose is 150.   ALT is elevated-55.  AST WNL.  Rest of CMP WNL.  Please clarify if he has been taking any tylenol? Alcohol use? Recent infection? Recent medication change?  If not--recommend reducing methotrexate to 6 tablets weekly and recheck Ast and ALT in 1 month CBC WNL.

## 2023-04-09 NOTE — Progress Notes (Signed)
 Glucose is 150.   ALT is elevated-55.  AST WNL.  Rest of CMP WNL.  Please clarify if he has been taking any tylenol? Alcohol use? Recent infection? Recent medication change?  If not--recommend reducing methotrexate to 6 tablets weekly and recheck Ast and ALT in 1 month CBC WNL.

## 2023-04-11 LAB — CBC WITH DIFFERENTIAL/PLATELET
Absolute Lymphocytes: 3337 {cells}/uL (ref 850–3900)
Absolute Monocytes: 680 {cells}/uL (ref 200–950)
Basophils Absolute: 57 {cells}/uL (ref 0–200)
Basophils Relative: 0.7 %
Eosinophils Absolute: 170 {cells}/uL (ref 15–500)
Eosinophils Relative: 2.1 %
HCT: 44.5 % (ref 38.5–50.0)
Hemoglobin: 14.9 g/dL (ref 13.2–17.1)
MCH: 31.7 pg (ref 27.0–33.0)
MCHC: 33.5 g/dL (ref 32.0–36.0)
MCV: 94.7 fL (ref 80.0–100.0)
MPV: 9.9 fL (ref 7.5–12.5)
Monocytes Relative: 8.4 %
Neutro Abs: 3856 {cells}/uL (ref 1500–7800)
Neutrophils Relative %: 47.6 %
Platelets: 324 10*3/uL (ref 140–400)
RBC: 4.7 10*6/uL (ref 4.20–5.80)
RDW: 12.5 % (ref 11.0–15.0)
Total Lymphocyte: 41.2 %
WBC: 8.1 10*3/uL (ref 3.8–10.8)

## 2023-04-11 LAB — COMPLETE METABOLIC PANEL WITH GFR
AG Ratio: 1.6 (calc) (ref 1.0–2.5)
ALT: 55 U/L — ABNORMAL HIGH (ref 9–46)
AST: 29 U/L (ref 10–40)
Albumin: 4.4 g/dL (ref 3.6–5.1)
Alkaline phosphatase (APISO): 87 U/L (ref 36–130)
BUN: 9 mg/dL (ref 7–25)
CO2: 28 mmol/L (ref 20–32)
Calcium: 9.4 mg/dL (ref 8.6–10.3)
Chloride: 101 mmol/L (ref 98–110)
Creat: 0.89 mg/dL (ref 0.60–1.26)
Globulin: 2.8 g/dL (ref 1.9–3.7)
Glucose, Bld: 150 mg/dL — ABNORMAL HIGH (ref 65–99)
Potassium: 4.2 mmol/L (ref 3.5–5.3)
Sodium: 137 mmol/L (ref 135–146)
Total Bilirubin: 1 mg/dL (ref 0.2–1.2)
Total Protein: 7.2 g/dL (ref 6.1–8.1)
eGFR: 113 mL/min/{1.73_m2} (ref >=60)

## 2023-04-11 LAB — QUANTIFERON-TB GOLD PLUS
Mitogen-NIL: >10 [IU]/mL
NIL: 0.09 [IU]/mL
QuantiFERON-TB Gold Plus: NEGATIVE
TB1-NIL: <0 [IU]/mL
TB2-NIL: 0 [IU]/mL

## 2023-04-12 NOTE — Progress Notes (Signed)
 TB gold negative

## 2023-04-19 ENCOUNTER — Other Ambulatory Visit: Payer: Self-pay | Admitting: Physician Assistant

## 2023-04-19 NOTE — Telephone Encounter (Signed)
 Last Fill: 03/26/2023  Next Visit: 07/19/2023  Last Visit: 02/17/2023  Dx: History of thoracic spinal fusion   Current Dose per office note on 02/17/2023: Flexeril 10 mg by bedtime   Okay to refill Flexeril?

## 2023-04-28 ENCOUNTER — Other Ambulatory Visit: Payer: Self-pay

## 2023-04-28 NOTE — Progress Notes (Signed)
 Specialty Pharmacy Refill Coordination Note  Joshua English is a 38 y.o. male contacted today regarding refills of specialty medication(s) Adalimumab (Humira (2 Pen))   Patient requested Delivery   Delivery date: 05/04/23   Verified address: [address]  [place] [address]   Medication will be filled on 05/03/23.

## 2023-05-03 ENCOUNTER — Other Ambulatory Visit: Payer: Self-pay

## 2023-05-06 ENCOUNTER — Other Ambulatory Visit (HOSPITAL_COMMUNITY): Payer: Self-pay

## 2023-05-19 ENCOUNTER — Other Ambulatory Visit: Payer: Self-pay | Admitting: *Deleted

## 2023-05-19 MED ORDER — CYCLOBENZAPRINE HCL 10 MG PO TABS: 10.0000 mg | ORAL_TABLET | Freq: Every day | ORAL | 0 refills | Status: DC

## 2023-05-19 NOTE — Telephone Encounter (Signed)
 Refill request received via fax from [facility]  for Flexeril   Last Fill: 04/19/2023  Next Visit: 07/19/2023  Last Visit: 02/17/2023  Dx: History of thoracic spinal fusion   Current Dose per office note on 02/17/2023: Flexeril 10 mg by bedtime   Okay to refill Flexeril?

## 2023-05-20 ENCOUNTER — Ambulatory Visit: Payer: Medicaid Other | Admitting: Dermatology

## 2023-05-24 ENCOUNTER — Ambulatory Visit: Admitting: Dermatology

## 2023-05-24 ENCOUNTER — Encounter: Payer: Self-pay | Admitting: Dermatology

## 2023-05-24 DIAGNOSIS — Z86018 Personal history of other benign neoplasm: Secondary | ICD-10-CM

## 2023-05-24 DIAGNOSIS — L905 Scar conditions and fibrosis of skin: Secondary | ICD-10-CM

## 2023-05-24 DIAGNOSIS — Z1283 Encounter for screening for malignant neoplasm of skin: Secondary | ICD-10-CM | POA: Diagnosis not present

## 2023-05-24 DIAGNOSIS — W908XXA Exposure to other nonionizing radiation, initial encounter: Secondary | ICD-10-CM

## 2023-05-24 DIAGNOSIS — L814 Other melanin hyperpigmentation: Secondary | ICD-10-CM | POA: Diagnosis not present

## 2023-05-24 DIAGNOSIS — D225 Melanocytic nevi of trunk: Secondary | ICD-10-CM

## 2023-05-24 DIAGNOSIS — L821 Other seborrheic keratosis: Secondary | ICD-10-CM

## 2023-05-24 DIAGNOSIS — D229 Melanocytic nevi, unspecified: Secondary | ICD-10-CM

## 2023-05-24 DIAGNOSIS — L578 Other skin changes due to chronic exposure to nonionizing radiation: Secondary | ICD-10-CM | POA: Diagnosis not present
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
## 2023-05-24 NOTE — Patient Instructions (Signed)

## 2023-05-25 ENCOUNTER — Other Ambulatory Visit: Payer: Self-pay | Admitting: Physician Assistant

## 2023-05-25 ENCOUNTER — Other Ambulatory Visit (HOSPITAL_COMMUNITY): Payer: Self-pay

## 2023-05-25 ENCOUNTER — Other Ambulatory Visit: Payer: Self-pay

## 2023-05-25 DIAGNOSIS — L4050 Arthropathic psoriasis, unspecified: Secondary | ICD-10-CM

## 2023-05-25 MED ORDER — HUMIRA (2 PEN) 40 MG/0.4ML ~~LOC~~ AJKT
AUTO-INJECTOR | SUBCUTANEOUS | 0 refills | Status: DC
  Filled 2023-05-25: qty 2, 28d supply, fill #0
  Filled 2023-06-21: qty 2, 28d supply, fill #1
  Filled 2023-07-22: qty 2, 28d supply, fill #2

## 2023-05-25 NOTE — Telephone Encounter (Signed)
 Last Fill: 03/02/2023  Labs: 04/08/2023 glucose is 150.   ALT is elevated-55.  AST WNL.  Rest of CMP WNL   TB Gold: 04/08/2023 Neg    Next Visit: 07/19/2023  Last Visit: 02/17/2023  [id number] arthritis   Current Dose per office note 02/17/2023: Humira 40 mg sq injection every 14 days   Okay to refill Humira?

## 2023-05-25 NOTE — Progress Notes (Signed)
 Specialty Pharmacy Refill Coordination Note  Joshua English is a 38 y.o. male contacted today regarding refills of specialty medication(s) Adalimumab (Humira (2 Pen))   Patient requested Delivery   Delivery date: 06/01/23   Verified address: [address]  [place] [address]   Medication will be filled on 05/31/23, pending refill approval.

## 2023-05-31 ENCOUNTER — Other Ambulatory Visit: Payer: Self-pay

## 2023-06-21 ENCOUNTER — Other Ambulatory Visit: Payer: Self-pay | Admitting: Physician Assistant

## 2023-06-21 ENCOUNTER — Other Ambulatory Visit: Payer: Self-pay

## 2023-06-21 ENCOUNTER — Other Ambulatory Visit: Payer: Self-pay | Admitting: Rheumatology

## 2023-06-21 NOTE — Telephone Encounter (Signed)
 Last Fill: 04/09/2023  Labs: 04/07/2026 Glucose is 150.   ALT is elevated-55.  AST WNL.  Rest of CMP WNL.  Please clarify if he has been taking any tylenol ? Alcohol use? Recent infection? Recent medication change? If not--recommend reducing methotrexate  to 6 tablets weekly and recheck Ast and ALT in 1 month CBC WNL  Next Visit: 07/19/2023  Last Visit: 02/17/2023  DX: Psoriatic arthritis   Current Dose per office note 02/17/2023: Methotrexate  2.5 mg 7 tablets once weekly   Patient advised he is needed to update labs.  Okay to refill Methotrexate ?

## 2023-06-21 NOTE — Progress Notes (Signed)
 Specialty Pharmacy Refill Coordination Note  Joshua English is a 38 y.o. male contacted today regarding refills of specialty medication(s) Adalimumab  (Humira  (2 Pen))   Patient requested Delivery   Delivery date: 06/29/23   Verified address: [address]  [place] [address]   Medication will be filled on 06/28/23.

## 2023-06-21 NOTE — Telephone Encounter (Signed)
 Last Fill: 05/19/2023   Next Visit: 07/19/2023   Last Visit: 02/17/2023   Dx: History of thoracic spinal fusion    Current Dose per office note on 02/17/2023: Flexeril  10 mg by bedtime    Okay to refill Flexeril ?

## 2023-06-28 ENCOUNTER — Other Ambulatory Visit: Payer: Self-pay

## 2023-06-30 ENCOUNTER — Other Ambulatory Visit: Payer: Self-pay | Admitting: *Deleted

## 2023-06-30 DIAGNOSIS — Z111 Encounter for screening for respiratory tuberculosis: Secondary | ICD-10-CM

## 2023-06-30 DIAGNOSIS — Z9225 Personal history of immunosupression therapy: Secondary | ICD-10-CM

## 2023-06-30 DIAGNOSIS — Z79899 Other long term (current) drug therapy: Secondary | ICD-10-CM

## 2023-07-01 ENCOUNTER — Ambulatory Visit: Payer: Self-pay | Admitting: Rheumatology

## 2023-07-01 LAB — CBC WITH DIFFERENTIAL/PLATELET
Absolute Lymphocytes: 3411 {cells}/uL (ref 850–3900)
Absolute Monocytes: 1064 {cells}/uL — ABNORMAL HIGH (ref 200–950)
Basophils Absolute: 76 {cells}/uL (ref 0–200)
Basophils Relative: 0.8 %
Eosinophils Absolute: 276 {cells}/uL (ref 15–500)
Eosinophils Relative: 2.9 %
HCT: 45.6 % (ref 38.5–50.0)
Hemoglobin: 15.2 g/dL (ref 13.2–17.1)
MCH: 31.7 pg (ref 27.0–33.0)
MCHC: 33.3 g/dL (ref 32.0–36.0)
MCV: 95 fL (ref 80.0–100.0)
MPV: 9.7 fL (ref 7.5–12.5)
Monocytes Relative: 11.2 %
Neutro Abs: 4674 {cells}/uL (ref 1500–7800)
Neutrophils Relative %: 49.2 %
Platelets: 302 10*3/uL (ref 140–400)
RBC: 4.8 10*6/uL (ref 4.20–5.80)
RDW: 12.6 % (ref 11.0–15.0)
Total Lymphocyte: 35.9 %
WBC: 9.5 10*3/uL (ref 3.8–10.8)

## 2023-07-01 LAB — COMPREHENSIVE METABOLIC PANEL WITH GFR
AG Ratio: 1.5 (calc) (ref 1.0–2.5)
ALT: 39 U/L (ref 9–46)
AST: 20 U/L (ref 10–40)
Albumin: 4.4 g/dL (ref 3.6–5.1)
Alkaline phosphatase (APISO): 89 U/L (ref 36–130)
BUN: 11 mg/dL (ref 7–25)
CO2: 27 mmol/L (ref 20–32)
Calcium: 9.3 mg/dL (ref 8.6–10.3)
Chloride: 103 mmol/L (ref 98–110)
Creat: 0.8 mg/dL (ref 0.60–1.26)
Globulin: 3 g/dL (ref 1.9–3.7)
Glucose, Bld: 93 mg/dL (ref 65–99)
Potassium: 4.5 mmol/L (ref 3.5–5.3)
Sodium: 137 mmol/L (ref 135–146)
Total Bilirubin: 0.8 mg/dL (ref 0.2–1.2)
Total Protein: 7.4 g/dL (ref 6.1–8.1)
eGFR: 116 mL/min/{1.73_m2} (ref >=60)

## 2023-07-01 NOTE — Progress Notes (Signed)
 CBC and CMP are stable.  Monocyte count is mildly elevated.  Will continue to monitor.

## 2023-07-06 NOTE — Progress Notes (Signed)
 Office Visit Note  Patient: Joshua English             Date of Birth: [birth year]           MRN: [id number]             PCP: Armida Berry, NP Referring: Armida Berry, NP Visit Date: 07/19/2023 Occupation: @GUAROCC @  Subjective:  Medication monitoring   History of Present Illness: Jaydyn Bozzo is a 38 y.o. male with history of psoriatic arthritis and osteoarthritis.  Patient remains on  Humira  40 mg sq injection every 14 days, Methotrexate  2.5 mg 6 tablets once weekly, and folic acid  1 mg 2 tablets by mouth daily.  He is tolerating combination therapy without any side effects and has not had any recent gaps in therapy.  Patient reduce the dose of methotrexate  from 7 tablets to 6 tablets weekly as advised after lab work from 04/08/2023 revealing an elevated ALT.  Patient states initially he had a mild flare of psoriasis on his left foot but otherwise did not notice any changes in symptoms on the reduced dose of methotrexate .  Patient states that the psoriasis resolved and he did not require any topical agents.  He has not had any recurrence.  Patient continues to have chronic pain in his back which he attributes to scoliosis.  He has difficulty sitting for prolonged periods of time due to the discomfort in his spine.  He denies any joint swelling at this time.  He denies any Achilles tendinitis or plantar fasciitis.  He denies any recent or recurrent infections.   Activities of Daily Living:  Patient reports morning stiffness for 1 hour.   Patient Reports nocturnal pain.  Difficulty dressing/grooming: Denies Difficulty climbing stairs: Denies Difficulty getting out of chair: Denies Difficulty using hands for taps, buttons, cutlery, and/or writing: Denies  Review of Systems  Constitutional: Negative.  Negative for fatigue.  HENT: Negative.  Negative for mouth sores and mouth dryness.   Eyes: Negative.  Negative for dryness.  Respiratory: Negative.  Negative for shortness of breath.    Cardiovascular: Negative.  Negative for chest pain and palpitations.  Gastrointestinal: Negative.  Negative for blood in stool, constipation and diarrhea.  Endocrine: Negative.  Negative for increased urination.  Genitourinary: Negative.  Negative for involuntary urination.  Musculoskeletal:  Positive for joint pain, joint pain and morning stiffness. Negative for gait problem, joint swelling, myalgias, muscle weakness, muscle tenderness and myalgias.  Skin: Negative.  Negative for color change, rash, hair loss and sensitivity to sunlight.  Allergic/Immunologic: Negative.  Negative for susceptible to infections.  Neurological: Negative.  Negative for dizziness and headaches.  Hematological: Negative.  Negative for swollen glands.  Psychiatric/Behavioral:  Positive for sleep disturbance. Negative for depressed mood. The patient is not nervous/anxious.     PMFS History:  Patient Active Problem List   Diagnosis Date Noted   Psoriatic arthritis (HCC) 02/21/2019   Other psoriasis 02/21/2019   High risk medication use 02/21/2019    Past Medical History:  Diagnosis Date   Dysplastic nevus 05/14/2021   R upper back - moderate   Dysplastic nevus 05/20/2022   Left upper arm - ant - moderate   Psoriatic arthritis (HCC)    Scliosis    arthritis -Psoriatic   Scoliosis     Family History  Problem Relation Age of Onset   Heart attack Father    Diabetes Maternal Grandmother    Hypertension Maternal Grandmother    Kidney disease Maternal Grandmother  Past Surgical History:  Procedure Laterality Date   BACK SURGERY  1994   for scoliosis   INSERTION OF MESH N/A 11/09/2016   Procedure: INSERTION OF MESH;  Surgeon: Caralyn Chandler, MD;  Location: [facility];  Service: General;  Laterality: N/A;   KNEE SURGERY Left    menicus repair    MOLE REMOVAL  04/2021   TONSILLECTOMY     UMBILICAL HERNIA REPAIR N/A 11/09/2016   Procedure: HERNIA REPAIR UMBILICAL ADULT;  Surgeon: Caralyn Chandler, MD;   Location: [facility] OR;  Service: General;  Laterality: N/A;   Social History   Social History Narrative   Not on file   Immunization History  Administered Date(s) Administered   DTaP 07/02/1997   Hepatitis A, Adult 06/19/2009   Hepatitis B, ADULT 07/28/1994, 12/31/1994, 08/24/1996   Influenza, Quadrivalent, Recombinant, Inj, Pf 12/14/2016, 12/14/2017   Influenza-Unspecified 01/10/2015, 11/09/2017   MMR 05/08/1993   PFIZER(Purple Top)SARS-COV-2 Vaccination 03/13/2019, 04/17/2019   Tdap 10/03/2015   Tetanus 08/12/2006     Objective: Vital Signs: BP (!) 141/92 (BP Location: Right Arm, Patient Position: Sitting, Cuff Size: Large)   Pulse 82   Resp 16   Ht 5\' 6"  (1.676 m)   Wt 195 lb 4.8 oz (88.6 kg)   SpO2 99%   BMI 31.52 kg/m    Physical Exam Vitals and nursing note reviewed.  Constitutional:      Appearance: He is well-developed.  HENT:     Head: Normocephalic and atraumatic.  Eyes:     Conjunctiva/sclera: Conjunctivae normal.     Pupils: Pupils are equal, round, and reactive to light.  Cardiovascular:     Rate and Rhythm: Normal rate and regular rhythm.     Heart sounds: Normal heart sounds.  Pulmonary:     Effort: Pulmonary effort is normal.     Breath sounds: Normal breath sounds.  Abdominal:     General: Bowel sounds are normal.     Palpations: Abdomen is soft.  Musculoskeletal:     Cervical back: Normal range of motion and neck supple.  Skin:    General: Skin is warm and dry.     Capillary Refill: Capillary refill takes less than 2 seconds.  Neurological:     Mental Status: He is alert and oriented to person, place, and time.  Psychiatric:        Behavior: Behavior normal.      Musculoskeletal Exam: C-spine has good range of motion.  Thoracolumbar scoliosis noted.  Shoulder joints, elbow joints, wrist joints, MCPs, PIPs, DIPs have good range of motion with no synovitis.  Complete fist formation bilaterally.  Hip joints have good range of motion with no groin  pain.  Knee joints have good range of motion no warmth or effusion.  Ankle joints have good range of motion with no tenderness or joint swelling.  No evidence of Achilles tendinitis.  CDAI Exam: CDAI Score: -- Patient Global: --; Provider Global: -- Swollen: --; Tender: -- Joint Exam 07/19/2023   No joint exam has been documented for this visit   There is currently no information documented on the homunculus. Go to the Rheumatology activity and complete the homunculus joint exam.  Investigation: No additional findings.  Imaging: No results found.  Recent Labs: Lab Results  Component Value Date   WBC 9.5 06/30/2023   HGB 15.2 06/30/2023   PLT 302 06/30/2023   NA 137 06/30/2023   K 4.5 06/30/2023   CL 103 06/30/2023   CO2 27 06/30/2023  GLUCOSE 93 06/30/2023   BUN 11 06/30/2023   CREATININE 0.80 06/30/2023   BILITOT 0.8 06/30/2023   AST 20 06/30/2023   ALT 39 06/30/2023   PROT 7.4 06/30/2023   CALCIUM 9.3 06/30/2023   GFRAA 137 07/25/2020   QFTBGOLDPLUS NEGATIVE 04/08/2023    Speciality Comments: Treatment by Dr. Kristian Petty response, Remicade -insurance issues Humira  start-02/21  Procedures:  No procedures performed Allergies: Patient has no known allergies.    n cancer.  Assessment / Plan:     Visit Diagnoses: Psoriatic arthritis (HCC) - Diagnosed while he was in college.  Previous patient of Dr. Henrine Logan.  (Enbrel -inadequate response, Remicade  discontinued due to insurance issues): He has no synovitis or dactylitis on examination today.  No evidence of Achilles tendinitis or plantar fasciitis.  Overall his symptoms have been stable on Humira  40 mg sq injections every 14 days, methotrexate  6 tablets by mouth once weekly, and folic acid  2 mg daily.  He is tolerating combination therapy without any side effects and has not had any recent gaps in therapy.  Patient had lab work on 04/08/2023 at which time his ALT was 55--he reduce the dose of methotrexate  from 7  tablets to 6 tablets weekly at that time.  He initially noticed a flare of psoriasis on his left foot but no other symptoms of a flare on the reduced dose of methotrexate .  The psoriasis has self resolved with no recurrence.  He has not been using any topical agents.  Overall his symptoms remain stable on the current treatment regimen.  No medication changes will be made at this time.  He was advised to notify us  if he develops any new or worsening symptoms.  He will follow-up in the office in 5 months or sooner if needed.  Psoriasis: No active psoriasis at this time.  Patient had a mild flare of psoriasis on the left foot after reducing the dose of methotrexate  from 7 tablets to 6 tablets weekly in February 2025.  The psoriasis was self resolving with no recurrence.  No medication changes will be made at this time.  He does not need any topical agents at this time.  He was advised to notify us  if he develops recurrence of psoriasis.  High risk medication use - Humira  40 mg sq injection every 14 days, Methotrexate  2.5 mg 6 tablets once weekly, and folic acid  1 mg 2 tablets by mouth daily. On the reduced dose of methotrexate  due to history of elevated LFTs. CBC and CMP updated on 06/30/23.  His next lab work will be due in August and every 3 months.  Standing orders for CBC and CMP remain in place. TB gold negative on 04/08/23. No recent or recurrent infections. Discussed the importance of holding humira  and methotrexate  if he develops signs or symptoms of an infection and to resume once the infection has completely cleared.   Discussed the importance of yearly skin cancer screening while on Humira  due to the increased risk for ski  Primary osteoarthritis of both hands: No tenderness or synovitis on exam.  No dactylitis noted.  Complete fist formation bilaterally.  Primary osteoarthritis of both feet: He is not experiencing any increased discomfort in his feet at this time.  He is wearing proper fitting  shoes.  No evidence of Achilles tendinitis.  Chronic SI joint pain: Intermittent discomfort--exacerbated by sitting for prolonged periods of time.  History of thoracic spinal fusion - Congenital scoliosis, surgery at age of 58 he has chronic discomfort in  his thoracic and lumbar region.  Thoracolumbar scoliosis noted.  He is taking Flexeril  10 mg by bedtime.  A refill Flexeril  sent to the pharmacy today.  He has difficulty sitting for prolonged periods of time due to the discomfort in his lower back.  Essential hypertension: Blood pressure was elevated today in the office and was rechecked prior to leaving.  Patient was advised to monitor blood pressure closely and to reach out to PCP if it remains elevated.  Orders: No orders of the defined types were placed in this encounter.  Meds ordered this encounter  Medications   cyclobenzaprine  (FLEXERIL ) 10 MG tablet    Sig: Take 1 tablet (10 mg total) by mouth at bedtime.    Dispense:  30 tablet    Refill:  0     Follow-Up Instructions: Return in about 5 months (around 12/19/2023) for Psoriatic arthritis.   Romayne Clubs, PA-C  Note - This record has been created using Dragon software.  Chart creation errors have been sought, but may not always  have been located. Such creation errors do not reflect on  the standard of medical care.

## 2023-07-07 ENCOUNTER — Other Ambulatory Visit (HOSPITAL_COMMUNITY): Payer: Self-pay

## 2023-07-12 ENCOUNTER — Other Ambulatory Visit: Payer: Self-pay | Admitting: Rheumatology

## 2023-07-12 NOTE — Telephone Encounter (Signed)
 Last Fill: 06/22/2023  Labs: 06/30/2023  CBC and CMP are stable.  Monocyte count is mildly elevated.  Will continue to monitor.   Next Visit: 07/19/2023  Last Visit: 02/17/2023  DX: Psoriatic arthritis   Current Dose per office note 02/17/2023: Methotrexate  2.5 mg 7 tablets once weekly   Okay to refill Methotrexate ?    Contacted patient to verify if he is taking 6 tablets or 7, patient advised he takes 6 tablets once weekly. Dose that patient is taking is different than the dose in the office note. Please review and change if needed.

## 2023-07-19 ENCOUNTER — Encounter: Payer: Self-pay | Admitting: Physician Assistant

## 2023-07-19 ENCOUNTER — Ambulatory Visit: Payer: Medicaid Other | Attending: Physician Assistant | Admitting: Physician Assistant

## 2023-07-19 VITALS — BP 141/92 | HR 82 | Resp 16 | Ht 66.0 in | Wt 195.3 lb

## 2023-07-19 DIAGNOSIS — M19041 Primary osteoarthritis, right hand: Secondary | ICD-10-CM | POA: Diagnosis not present

## 2023-07-19 DIAGNOSIS — Z79899 Other long term (current) drug therapy: Secondary | ICD-10-CM

## 2023-07-19 DIAGNOSIS — G8929 Other chronic pain: Secondary | ICD-10-CM

## 2023-07-19 DIAGNOSIS — M19071 Primary osteoarthritis, right ankle and foot: Secondary | ICD-10-CM

## 2023-07-19 DIAGNOSIS — L409 Psoriasis, unspecified: Secondary | ICD-10-CM | POA: Diagnosis present

## 2023-07-19 DIAGNOSIS — I10 Essential (primary) hypertension: Secondary | ICD-10-CM | POA: Diagnosis present

## 2023-07-19 DIAGNOSIS — M19072 Primary osteoarthritis, left ankle and foot: Secondary | ICD-10-CM | POA: Diagnosis present

## 2023-07-19 DIAGNOSIS — L4050 Arthropathic psoriasis, unspecified: Secondary | ICD-10-CM

## 2023-07-19 DIAGNOSIS — M533 Sacrococcygeal disorders, not elsewhere classified: Secondary | ICD-10-CM

## 2023-07-19 DIAGNOSIS — M19042 Primary osteoarthritis, left hand: Secondary | ICD-10-CM | POA: Diagnosis present

## 2023-07-19 DIAGNOSIS — Z981 Arthrodesis status: Secondary | ICD-10-CM

## 2023-07-19 MED ORDER — CYCLOBENZAPRINE HCL 10 MG PO TABS: 10.0000 mg | ORAL_TABLET | Freq: Every day | ORAL | 0 refills | Status: DC

## 2023-07-19 NOTE — Patient Instructions (Addendum)
 Standing Labs We placed an order today for your standing lab work.   Please have your standing labs drawn in mid-August and every 3 months    Please have your labs drawn 2 weeks prior to your appointment so that the provider can discuss your lab results at your appointment, if possible.  Please note that you may see your imaging and lab results in MyChart before we have reviewed them. We will contact you once all results are reviewed. Please allow our office up to 72 hours to thoroughly review all of the results before contacting the office for clarification of your results.  WALK-IN LAB HOURS  Monday through Thursday from 8:00 am -12:30 pm and 1:00 pm-4:00 pm and Friday from 8:00 am-12:00 pm.  Patients with office visits requiring labs will be seen before walk-in labs.  You may encounter longer than normal wait times. Please allow additional time. Wait times may be shorter on  Monday and Thursday afternoons.  We do not book appointments for walk-in labs. We appreciate your patience and understanding with our staff.   Labs are drawn by [facility]. Please bring your co-pay at the time of your lab draw.  You may receive a bill from [facility] for your lab work.  Please note if you are on Hydroxychloroquine and and an order has been placed for a Hydroxychloroquine level,  you will need to have it drawn 4 hours or more after your last dose.  If you wish to have your labs drawn at another location, please call the office 24 hours in advance so we can fax the orders.  The office is located at [address], [address], [place], [place] [address]   If you have any questions regarding directions or hours of operation,  please call [phone].   As a reminder, please drink plenty of water prior to coming for your lab work. Thanks!

## 2023-07-22 ENCOUNTER — Other Ambulatory Visit: Payer: Self-pay

## 2023-07-22 NOTE — Progress Notes (Signed)
 Specialty Pharmacy Refill Coordination Note  Joshua English is a 37 y.o. male contacted today regarding refills of specialty medication(s) Adalimumab  (Humira  (2 Pen))   Patient requested Delivery   Delivery date: 07/23/23   Verified address: [address]  [place] [address]   Medication will be filled on 07/22/23.

## 2023-08-08 ENCOUNTER — Other Ambulatory Visit: Payer: Self-pay | Admitting: Rheumatology

## 2023-08-09 NOTE — Telephone Encounter (Signed)
 Last Fill: 07/19/2023  Next Visit: 12/22/2023  Last Visit: 07/19/2023  Dx: History of thoracic spinal fusion   Current Dose per office note on 07/19/2023: Flexeril  10 mg by bedtime   Insurance only allows patient 21 days at a time.  Okay to refill Flexeril ?

## 2023-08-19 ENCOUNTER — Other Ambulatory Visit: Payer: Self-pay | Admitting: Rheumatology

## 2023-08-19 ENCOUNTER — Other Ambulatory Visit: Payer: Self-pay

## 2023-08-19 ENCOUNTER — Other Ambulatory Visit (HOSPITAL_COMMUNITY): Payer: Self-pay

## 2023-08-19 DIAGNOSIS — L4050 Arthropathic psoriasis, unspecified: Secondary | ICD-10-CM

## 2023-08-19 MED ORDER — HUMIRA (2 PEN) 40 MG/0.4ML ~~LOC~~ AJKT
AUTO-INJECTOR | SUBCUTANEOUS | 0 refills | Status: DC
  Filled 2023-08-19: qty 2, 28d supply, fill #0
  Filled 2023-09-13: qty 2, 28d supply, fill #1
  Filled 2023-10-12: qty 2, 28d supply, fill #2

## 2023-08-19 NOTE — Progress Notes (Signed)
 Specialty Pharmacy Refill Coordination Note  Joshua English is a 38 y.o. male contacted today regarding refills of specialty medication(s) Adalimumab  (Humira  (2 Pen))   Patient requested Delivery   Delivery date: 08/24/23   Verified address: [address]  [place] [address]   Medication will be filled on 08/23/23.

## 2023-08-19 NOTE — Telephone Encounter (Signed)
 Last Fill: 05/25/2023  Labs: 06/30/2023 CBC and CMP are stable.  Monocyte count is mildly elevated.   TB Gold: 04/08/2023 Neg    Next Visit: 12/22/2023  Last Visit: 07/19/2023  DX: Psoriatic arthritis   Current Dose per office note 07/19/2023: Humira  40 mg sq injection every 14 days   Okay to refill Humira ?

## 2023-08-30 ENCOUNTER — Other Ambulatory Visit: Payer: Self-pay | Admitting: Rheumatology

## 2023-08-30 ENCOUNTER — Other Ambulatory Visit: Payer: Self-pay

## 2023-08-30 MED ORDER — CYCLOBENZAPRINE HCL 10 MG PO TABS: 10.0000 mg | ORAL_TABLET | Freq: Every day | ORAL | 0 refills | Status: DC

## 2023-08-30 NOTE — Telephone Encounter (Signed)
 Last Fill: 08/09/2023  Next Visit: 12/22/2023  Last Visit: 07/19/2023  Dx: Psoriatic arthritis   Current Dose per office note on 07/19/2023: Flexeril  10 mg by bedtime   Okay to refill Flexeril ?

## 2023-08-30 NOTE — Telephone Encounter (Signed)
 Last Fill: 09/01/2022  Next Visit: 12/22/2023  Last Visit: 07/19/2023  Dx: Psoriatic arthritis   Current Dose per office note on 07/19/2023: folic acid  1 mg 2 tablets by mouth daily   Okay to refill Folic Acid ?

## 2023-09-02 ENCOUNTER — Other Ambulatory Visit: Payer: Self-pay | Admitting: Rheumatology

## 2023-09-13 ENCOUNTER — Other Ambulatory Visit: Payer: Self-pay

## 2023-09-13 ENCOUNTER — Other Ambulatory Visit (HOSPITAL_COMMUNITY): Payer: Self-pay

## 2023-09-13 NOTE — Progress Notes (Signed)
 Specialty Pharmacy Ongoing Clinical Assessment Note  Joshua English is a 38 y.o. male who is being followed by the specialty pharmacy service for RxSp Psoriatic Arthritis   Patient's specialty medication(s) reviewed today: Adalimumab  (Humira  (2 Pen))   Missed doses in the last 4 weeks: 0   Patient/Caregiver did not have any additional questions or concerns.   Therapeutic benefit summary: Patient is achieving benefit   Adverse events/side effects summary: No adverse events/side effects   Patient's therapy is appropriate to: Continue    Goals Addressed             This Visit's Progress    Minimize recurrence of flares   On track    Patient is on track. Patient will maintain adherence.  Patient reports that he is well-controlled on therapy at this time with no recent flares.          Follow up: 12 months  Silvano LOISE Dolly Specialty Pharmacist

## 2023-09-13 NOTE — Progress Notes (Signed)
 Specialty Pharmacy Refill Coordination Note  Joshua English is a 38 y.o. male contacted today regarding refills of specialty medication(s) Adalimumab  (Humira  (2 Pen))   Patient requested Delivery   Delivery date: 09/21/23   Verified address: [address]  [place] [address]   Medication will be filled on 09/20/23.

## 2023-09-20 ENCOUNTER — Telehealth: Payer: Self-pay

## 2023-09-20 ENCOUNTER — Other Ambulatory Visit (HOSPITAL_COMMUNITY): Payer: Self-pay

## 2023-09-20 ENCOUNTER — Other Ambulatory Visit: Payer: Self-pay

## 2023-09-20 NOTE — Telephone Encounter (Signed)
 Received notification from [facility] MEDICAID regarding a prior authorization for HUMIRA . Authorization has been APPROVED from 09/20/2023 to 09/19/2024. Approval letter sent to scan center.  Authorization # [id number]

## 2023-09-20 NOTE — Telephone Encounter (Signed)
 Received notification from [facility] pharmacy that patient requires a new authorization for their medication.  Submitted an URGENT Prior Authorization request to [facility] MEDICAID for HUMIRA  via CoverMyMeds. Will update once we receive a response.  Key: [id number]

## 2023-09-21 ENCOUNTER — Other Ambulatory Visit: Payer: Self-pay

## 2023-10-06 ENCOUNTER — Other Ambulatory Visit: Payer: Self-pay | Admitting: Rheumatology

## 2023-10-06 ENCOUNTER — Other Ambulatory Visit: Payer: Self-pay | Admitting: Physician Assistant

## 2023-10-06 NOTE — Telephone Encounter (Signed)
 Last Fill: 08/30/2023   Next Visit: 12/22/2023   Last Visit: 07/19/2023   Dx: Psoriatic arthritis    Current Dose per office note on 07/19/2023: Flexeril  10 mg by bedtime    Okay to refill Flexeril ?

## 2023-10-06 NOTE — Telephone Encounter (Signed)
 Last Fill: 07/12/2023  Labs: 06/30/2023 CBC and CMP are stable.  Monocyte count is mildly elevated.   Next Visit: 12/22/2023  Last Visit: 07/19/2023  DX: Psoriatic arthritis   Current Dose per office note 07/19/2023: Methotrexate  2.5 mg 6 tablets once weekly   Patient advised he is due to update his lab work. Patient states he will come in this week to update his labs.   Okay to refill Methotrexate ?

## 2023-10-08 ENCOUNTER — Other Ambulatory Visit (HOSPITAL_COMMUNITY): Payer: Self-pay

## 2023-10-08 ENCOUNTER — Other Ambulatory Visit: Payer: Self-pay | Admitting: *Deleted

## 2023-10-08 DIAGNOSIS — Z79899 Other long term (current) drug therapy: Secondary | ICD-10-CM

## 2023-10-12 ENCOUNTER — Other Ambulatory Visit: Payer: Self-pay

## 2023-10-12 NOTE — Progress Notes (Signed)
 Specialty Pharmacy Refill Coordination Note  Joshua English is a 38 y.o. male contacted today regarding refills of specialty medication(s) Adalimumab  (Humira  (2 Pen))   Patient requested Delivery   Delivery date: 10/15/23   Verified address: [address]  [place] [address]   Medication will be filled on 10/14/23.

## 2023-10-13 ENCOUNTER — Other Ambulatory Visit: Payer: Self-pay

## 2023-10-14 ENCOUNTER — Other Ambulatory Visit: Payer: Self-pay | Admitting: *Deleted

## 2023-10-14 ENCOUNTER — Other Ambulatory Visit (HOSPITAL_COMMUNITY): Payer: Self-pay

## 2023-10-14 DIAGNOSIS — Z79899 Other long term (current) drug therapy: Secondary | ICD-10-CM

## 2023-10-15 ENCOUNTER — Ambulatory Visit: Payer: Self-pay | Admitting: Rheumatology

## 2023-10-15 LAB — CBC WITH DIFFERENTIAL/PLATELET
Absolute Lymphocytes: 3196 {cells}/uL (ref 850–3900)
Absolute Monocytes: 961 {cells}/uL — ABNORMAL HIGH (ref 200–950)
Basophils Absolute: 60 {cells}/uL (ref 0–200)
Basophils Relative: 0.7 %
Eosinophils Absolute: 213 {cells}/uL (ref 15–500)
Eosinophils Relative: 2.5 %
HCT: 45.5 % (ref 38.5–50.0)
Hemoglobin: 15 g/dL (ref 13.2–17.1)
MCH: 31.4 pg (ref 27.0–33.0)
MCHC: 33 g/dL (ref 32.0–36.0)
MCV: 95.4 fL (ref 80.0–100.0)
MPV: 9.7 fL (ref 7.5–12.5)
Monocytes Relative: 11.3 %
Neutro Abs: 4072 {cells}/uL (ref 1500–7800)
Neutrophils Relative %: 47.9 %
Platelets: 309 Thousand/uL (ref 140–400)
RBC: 4.77 Million/uL (ref 4.20–5.80)
RDW: 13 % (ref 11.0–15.0)
Total Lymphocyte: 37.6 %
WBC: 8.5 Thousand/uL (ref 3.8–10.8)

## 2023-10-15 LAB — COMPREHENSIVE METABOLIC PANEL WITH GFR
AG Ratio: 1.5 (calc) (ref 1.0–2.5)
ALT: 29 U/L (ref 9–46)
AST: 18 U/L (ref 10–40)
Albumin: 4.6 g/dL (ref 3.6–5.1)
Alkaline phosphatase (APISO): 87 U/L (ref 36–130)
BUN: 12 mg/dL (ref 7–25)
CO2: 29 mmol/L (ref 20–32)
Calcium: 9.6 mg/dL (ref 8.6–10.3)
Chloride: 103 mmol/L (ref 98–110)
Creat: 0.73 mg/dL (ref 0.60–1.26)
Globulin: 3.1 g/dL (ref 1.9–3.7)
Glucose, Bld: 98 mg/dL (ref 65–99)
Potassium: 4.5 mmol/L (ref 3.5–5.3)
Sodium: 139 mmol/L (ref 135–146)
Total Bilirubin: 0.6 mg/dL (ref 0.2–1.2)
Total Protein: 7.7 g/dL (ref 6.1–8.1)
eGFR: 119 mL/min/1.73m2 (ref >=60)

## 2023-10-15 NOTE — Progress Notes (Signed)
 CBC and CMP normal

## 2023-10-28 ENCOUNTER — Other Ambulatory Visit: Payer: Self-pay

## 2023-10-28 MED ORDER — CYCLOBENZAPRINE HCL 10 MG PO TABS: 10.0000 mg | ORAL_TABLET | Freq: Every day | ORAL | 0 refills | Status: DC

## 2023-10-28 NOTE — Telephone Encounter (Signed)
 Refill request received via fax from [facility] for Flexeril    Last Fill: 10/06/2023  Next Visit: 12/22/2023  Last Visit: 07/19/2023  Dx: History of thoracic spinal fusion   Current Dose per office note on 07/19/2023: Flexeril  10 mg by bedtime   Okay to refill Flexeril ?

## 2023-11-09 ENCOUNTER — Other Ambulatory Visit: Payer: Self-pay | Admitting: Physician Assistant

## 2023-11-09 ENCOUNTER — Other Ambulatory Visit (HOSPITAL_COMMUNITY): Payer: Self-pay

## 2023-11-09 ENCOUNTER — Other Ambulatory Visit: Payer: Self-pay

## 2023-11-09 DIAGNOSIS — L4050 Arthropathic psoriasis, unspecified: Secondary | ICD-10-CM

## 2023-11-09 MED ORDER — HUMIRA (2 PEN) 40 MG/0.4ML ~~LOC~~ AJKT
AUTO-INJECTOR | SUBCUTANEOUS | 0 refills | Status: DC
  Filled 2023-11-09: qty 2, 28d supply, fill #0
  Filled 2023-12-06: qty 2, 28d supply, fill #1
  Filled 2024-01-05: qty 2, 28d supply, fill #2

## 2023-11-09 NOTE — Progress Notes (Signed)
 Specialty Pharmacy Refill Coordination Note  Joshua English is a 38 y.o. male contacted today regarding refills of specialty medication(s) Adalimumab  (Humira  (2 Pen))   Patient requested Delivery   Delivery date: 11/16/23   Verified address: [address]  [place] [address]   Medication will be filled on 11/15/23.    This fill date is pending response to refill request from provider. Patient is aware and if they have not received fill by intended date they must follow up with pharmacy.

## 2023-11-09 NOTE — Telephone Encounter (Signed)
 Last Fill: 08/19/2023  Labs: 10/14/2023 CBC and CMP normal.   TB Gold: 04/08/2023 Neg   Next Visit: 12/22/2023  Last Visit: 07/19/2023  IK:Ednmpjupr arthritis   Current Dose per office note 07/19/2023:  Humira  40 mg sq injection every 14 days   Okay to refill Humira ?

## 2023-11-15 ENCOUNTER — Other Ambulatory Visit: Payer: Self-pay

## 2023-11-17 ENCOUNTER — Other Ambulatory Visit: Payer: Self-pay

## 2023-11-17 LAB — LAB REPORT - SCANNED
A1c: 5.8
EGFR: 118

## 2023-11-17 MED ORDER — CYCLOBENZAPRINE HCL 10 MG PO TABS: 10.0000 mg | ORAL_TABLET | Freq: Every day | ORAL | 0 refills | Status: DC

## 2023-11-17 NOTE — Telephone Encounter (Signed)
 Refill request received via fax from Joshua English for Flexeril    Last Fill: 10/28/2023  Next Visit: 12/22/2023  Last Visit: 07/19/2023  Dx: History of thoracic spinal fusion   Current Dose per office note on 07/19/2023: Flexeril  10 mg by bedtime.   Okay to refill Flexeril ?

## 2023-11-18 ENCOUNTER — Other Ambulatory Visit: Payer: Self-pay | Admitting: Physician Assistant

## 2023-11-23 ENCOUNTER — Telehealth: Payer: Self-pay

## 2023-11-23 NOTE — Telephone Encounter (Signed)
 Labs received from:[facility]  Drawn on:11/16/2023  Reviewed by:Waddell Craze, PA-C  Labs drawn:CBC, CMP, Lipid Panel, Hemoglobin A1c, Folate  Results:Glucose 106 Triglycerides 254 VLDL Cholesterol Cal 43 Hemoglobin A1c 5.8

## 2023-12-06 ENCOUNTER — Other Ambulatory Visit (HOSPITAL_COMMUNITY): Payer: Self-pay

## 2023-12-06 ENCOUNTER — Other Ambulatory Visit: Payer: Self-pay

## 2023-12-06 NOTE — Progress Notes (Signed)
 Specialty Pharmacy Refill Coordination Note  Joshua English is a 38 y.o. male contacted today regarding refills of specialty medication(s) Adalimumab  (Humira  (2 Pen))   Patient requested Delivery   Delivery date: 12/10/23   Verified address: [address]  [place] [address]   Medication will be filled on: 12/09/23

## 2023-12-08 ENCOUNTER — Other Ambulatory Visit: Payer: Self-pay

## 2023-12-09 ENCOUNTER — Other Ambulatory Visit (HOSPITAL_COMMUNITY): Payer: Self-pay

## 2023-12-09 NOTE — Progress Notes (Signed)
 Office Visit Note  Patient: Joshua English             Date of Birth: 1985/09/27           MRN: [id number]             PCP: Barbra Odor, NP Referring: Barbra Odor, NP Visit Date: 12/22/2023 Occupation: Data Unavailable  Subjective:  Medication management  History of Present Illness: Joshua English is a 38 y.o. male with psoriatic arthritis and osteoarthritis overlap.  Has not had a flare of psoriatic arthritis since the last visit.  He states he had a small patch of psoriasis behind his left ear which resolved.  He denies having plantar fasciitis or Achilles tendinitis.  There is no history of uveitis.  He is on Humira  40 mg subcu every 14 days, methotrexate  6 tablets weekly and folic acid  1 mg, 2 tablets daily.  Has any interruption in the treatment.  He continues to have discomfort in his back especially from prolonged sitting.  He has been taking Flexeril  10 mg p.o. nightly for pain relief and muscle spasms.    Activities of Daily Living:  Patient reports morning stiffness for 1-1.5 hours.   Patient Reports nocturnal pain.  Difficulty dressing/grooming: Denies Difficulty climbing stairs: Denies Difficulty getting out of chair: Denies Difficulty using hands for taps, buttons, cutlery, and/or writing: Denies  Review of Systems  Constitutional:  Positive for fatigue.  HENT:  Negative for mouth sores and mouth dryness.   Eyes:  Negative for dryness.  Respiratory:  Negative for shortness of breath.   Cardiovascular:  Negative for chest pain and palpitations.  Gastrointestinal:  Negative for blood in stool, constipation and diarrhea.  Endocrine: Negative for increased urination.  Genitourinary:  Negative for involuntary urination.  Musculoskeletal:  Positive for joint pain, joint pain, joint swelling and morning stiffness. Negative for gait problem, myalgias, muscle weakness, muscle tenderness and myalgias.  Skin:  Negative for color change, rash, hair loss and sensitivity  to sunlight.  Allergic/Immunologic: Negative for susceptible to infections.  Neurological:  Negative for dizziness and headaches.  Hematological:  Negative for swollen glands.  Psychiatric/Behavioral:  Positive for sleep disturbance. Negative for depressed mood. The patient is not nervous/anxious.     PMFS History:  Patient Active Problem List   Diagnosis Date Noted   Psoriatic arthritis (HCC) 02/21/2019   Other psoriasis 02/21/2019   High risk medication use 02/21/2019    Past Medical History:  Diagnosis Date   Dysplastic nevus 05/14/2021   R upper back - moderate   Dysplastic nevus 05/20/2022   Left upper arm - ant - moderate   Psoriatic arthritis (HCC)    Scliosis    arthritis -Psoriatic   Scoliosis     Family History  Problem Relation Age of Onset   Heart attack Father    Diabetes Maternal Grandmother    Hypertension Maternal Grandmother    Kidney disease Maternal Grandmother    Past Surgical History:  Procedure Laterality Date   BACK SURGERY  1994   for scoliosis   INSERTION OF MESH N/A 11/09/2016   Procedure: INSERTION OF MESH;  Surgeon: Curvin Deward MOULD, MD;  Location: [facility] OR;  Service: General;  Laterality: N/A;   KNEE SURGERY Left    menicus repair    MOLE REMOVAL  04/2021   TONSILLECTOMY     UMBILICAL HERNIA REPAIR N/A 11/09/2016   Procedure: HERNIA REPAIR UMBILICAL ADULT;  Surgeon: Curvin Deward MOULD, MD;  Location: [facility] OR;  Service:  General;  Laterality: N/A;   Social History   Tobacco Use   Smoking status: Never    Passive exposure: Never   Smokeless tobacco: Never  Vaping Use   Vaping status: Never Used  Substance Use Topics   Alcohol use: Yes    Comment: occ   Drug use: No   Social History   Social History Narrative   Not on file     Immunization History  Administered Date(s) Administered   DTaP 07/02/1997   Hepatitis A, Adult 06/19/2009   Hepatitis B, ADULT 07/28/1994, 12/31/1994, 08/24/1996   Influenza, Quadrivalent, Recombinant, Inj, Pf  12/14/2016, 12/14/2017   Influenza-Unspecified 01/10/2015, 11/09/2017   MMR 05/08/1993   PFIZER(Purple Top)SARS-COV-2 Vaccination 03/13/2019, 04/17/2019   Tdap 10/03/2015   Tetanus 08/12/2006     Objective: Vital Signs: BP (!) 142/94   Pulse 83   Temp 98.3 F (36.8 C)   Resp 16   Ht 5' 6 (1.676 m)   Wt 197 lb (89.4 kg)   BMI 31.80 kg/m    Physical Exam Vitals and nursing note reviewed.  Constitutional:      Appearance: He is well-developed.  HENT:     Head: Normocephalic and atraumatic.  Eyes:     Conjunctiva/sclera: Conjunctivae normal.     Pupils: Pupils are equal, round, and reactive to light.  Cardiovascular:     Rate and Rhythm: Normal rate and regular rhythm.     Heart sounds: Normal heart sounds.  Pulmonary:     Effort: Pulmonary effort is normal.     Breath sounds: Normal breath sounds.  Abdominal:     General: Bowel sounds are normal.     Palpations: Abdomen is soft.  Musculoskeletal:     Cervical back: Normal range of motion and neck supple.  Skin:    General: Skin is warm and dry.     Capillary Refill: Capillary refill takes less than 2 seconds.  Neurological:     Mental Status: He is alert and oriented to person, place, and time.  Psychiatric:        Behavior: Behavior normal.      Musculoskeletal Exam: He had good range of motion of the cervical spine.  Severe thoracolumbar scoliosis was noted.  He had limited range of motion of the thoracic and lumbar spine.  Shoulders, elbows, wrist, MCPs PIPs and DIPs were in good range of motion with no synovitis.  Hip joints and knee joints in good range of motion.  There was no tenderness over ankles or MTPs.  There was no Achilles tendinitis or plantar fasciitis.  CDAI Exam: CDAI Score: -- Patient Global: --; Provider Global: -- Swollen: --; Tender: -- Joint Exam 12/22/2023   No joint exam has been documented for this visit   There is currently no information documented on the homunculus. Go to the  Rheumatology activity and complete the homunculus joint exam.  Investigation: No additional findings.  Imaging: No results found.  Recent Labs: Lab Results  Component Value Date   WBC 8.5 10/14/2023   HGB 15.0 10/14/2023   PLT 309 10/14/2023   NA 139 10/14/2023   K 4.5 10/14/2023   CL 103 10/14/2023   CO2 29 10/14/2023   GLUCOSE 98 10/14/2023   BUN 12 10/14/2023   CREATININE 0.73 10/14/2023   BILITOT 0.6 10/14/2023   AST 18 10/14/2023   ALT 29 10/14/2023   PROT 7.7 10/14/2023   CALCIUM 9.6 10/14/2023   GFRAA 137 07/25/2020   QFTBGOLDPLUS NEGATIVE 04/08/2023  Speciality Comments: Treatment by Dr. Leni Felts response, Remicade -insurance issues Humira  start-02/21  Procedures:  No procedures performed Allergies: Patient has no known allergies.   Assessment / Plan:     Visit Diagnoses: Psoriatic arthritis (HCC) - Diagnosed while he was in college.  Previous patient of Dr. Leni.  (Enbrel -inadequate response, Remicade  discontinued due to insurance issues): -He has been doing well on the combination of Humira  and methotrexate .  The dose of methotrexate  was reduced due to elevated LFTs.  He denies having a flare of psoriatic arthritis.  He had mild psoriasis behind his left ear which is improving.  He requested prescription refill for methotrexate  which was sent.  He denies any interruption in the treatment.  Plan: methotrexate  (RHEUMATREX) 2.5 MG tablet  Psoriasis-dry skin was noted behind the left ear pinna.  No active psoriasis lesions were noted.  High risk medication use - Humira  40 mg sq injection every 14 days, Methotrexate  2.5 mg 6 tablets once weekly, and folic acid  1 mg 2 tablets by mouth daily.  October 14, 2023 CBC and CMP were normal.  TB Gold was negative on April 08, 2023.  Annual TB Gold was advised.  Repeat labs were advised in December and every 3 months.  Information reimmunization was placed in the AVS.  He was advised to call Humira  and  methotrexate  if he develops an infection resume after the infection resolves.  Annual skin examination to screen for skin cancer was advised.  Use of sunscreen and sun protection was discussed.  Primary osteoarthritis of both hands-no synovitis or synovial thickening was noted.  Primary osteoarthritis of both feet-he denies any discomfort today.  Chronic SI joint pain - Intermittent discomfort--exacerbated by sitting for prolonged periods of time.  He takes cyclobenzaprine  10 mg p.o. nightly.  I discussed getting the Flexeril  tablet and to have him try Flexeril  5 mg p.o. nightly.  If he tolerates that we can reduce future dose.- Plan: cyclobenzaprine  (FLEXERIL ) 10 MG tablet  History of thoracic spinal fusion - Congenital scoliosis, surgery at age of 41 he has chronic discomfort in his thoracic and lumbar region.  Thoracolumbar scoliosis noted.  Essential hypertension-blood pressure was elevated at 142/94.  Repeat blood pressure was 135/89.  He was advised to monitor blood pressure closely and follow-up with his PCP.  Screening for hyperlipidemia -will check hemoglobin A1c with his next labs.  Orders: No orders of the defined types were placed in this encounter.  Meds ordered this encounter  Medications   cyclobenzaprine  (FLEXERIL ) 10 MG tablet    Sig: Take 1 tablet (10 mg total) by mouth at bedtime.    Dispense:  30 tablet    Refill:  0   methotrexate  (RHEUMATREX) 2.5 MG tablet    Sig: Take 6 tablets (15 mg total) by mouth once a week.    Dispense:  72 tablet    Refill:  0    Follow-Up Instructions: Return in about 5 months (around 05/21/2024) for Psoriatic arthritis, Osteoarthritis.   Maya Nash, MD  Note - This record has been created using Animal nutritionist.  Chart creation errors have been sought, but may not always  have been located. Such creation errors do not reflect on  the standard of medical care.

## 2023-12-22 ENCOUNTER — Ambulatory Visit: Attending: Rheumatology | Admitting: Rheumatology

## 2023-12-22 ENCOUNTER — Encounter: Payer: Self-pay | Admitting: Rheumatology

## 2023-12-22 VITALS — BP 135/89 | HR 82 | Temp 98.3°F | Resp 16 | Ht 66.0 in | Wt 197.0 lb

## 2023-12-22 DIAGNOSIS — M19071 Primary osteoarthritis, right ankle and foot: Secondary | ICD-10-CM | POA: Insufficient documentation

## 2023-12-22 DIAGNOSIS — Z79899 Other long term (current) drug therapy: Secondary | ICD-10-CM | POA: Diagnosis present

## 2023-12-22 DIAGNOSIS — L409 Psoriasis, unspecified: Secondary | ICD-10-CM | POA: Insufficient documentation

## 2023-12-22 DIAGNOSIS — G8929 Other chronic pain: Secondary | ICD-10-CM | POA: Diagnosis present

## 2023-12-22 DIAGNOSIS — L4050 Arthropathic psoriasis, unspecified: Secondary | ICD-10-CM | POA: Diagnosis not present

## 2023-12-22 DIAGNOSIS — I10 Essential (primary) hypertension: Secondary | ICD-10-CM | POA: Insufficient documentation

## 2023-12-22 DIAGNOSIS — M533 Sacrococcygeal disorders, not elsewhere classified: Secondary | ICD-10-CM | POA: Insufficient documentation

## 2023-12-22 DIAGNOSIS — M19042 Primary osteoarthritis, left hand: Secondary | ICD-10-CM | POA: Diagnosis present

## 2023-12-22 DIAGNOSIS — Z981 Arthrodesis status: Secondary | ICD-10-CM | POA: Insufficient documentation

## 2023-12-22 DIAGNOSIS — M19072 Primary osteoarthritis, left ankle and foot: Secondary | ICD-10-CM | POA: Diagnosis present

## 2023-12-22 DIAGNOSIS — Z13220 Encounter for screening for lipoid disorders: Secondary | ICD-10-CM | POA: Insufficient documentation

## 2023-12-22 DIAGNOSIS — M19041 Primary osteoarthritis, right hand: Secondary | ICD-10-CM | POA: Diagnosis present

## 2023-12-22 MED ORDER — CYCLOBENZAPRINE HCL 10 MG PO TABS: 10.0000 mg | ORAL_TABLET | Freq: Every day | ORAL | 0 refills | Status: AC

## 2023-12-22 MED ORDER — METHOTREXATE SODIUM 2.5 MG PO TABS: 15.0000 mg | ORAL_TABLET | ORAL | 0 refills | Status: AC

## 2023-12-22 NOTE — Patient Instructions (Addendum)
 Standing Labs We placed an order today for your standing lab work.   Please have your standing labs drawn in December and every 3 months.  Please have your labs drawn 2 weeks prior to your appointment so that the provider can discuss your lab results at your appointment, if possible.  Please note that you may see your imaging and lab results in MyChart before we have reviewed them. We will contact you once all results are reviewed. Please allow our office up to 72 hours to thoroughly review all of the results before contacting the office for clarification of your results.  WALK-IN LAB HOURS  Monday through Thursday from 8:00 am -12:30 pm and 1:00 pm-4:30 pm and Friday from 8:00 am-12:00 pm.  Patients with office visits requiring labs will be seen before walk-in labs.  You may encounter longer than normal wait times. Please allow additional time. Wait times may be shorter on  Monday and Thursday afternoons.  We do not book appointments for walk-in labs. We appreciate your patience and understanding with our staff.   Labs are drawn by [facility]. Please bring your co-pay at the time of your lab draw.  You may receive a bill from [facility] for your lab work.  Please note if you are on Hydroxychloroquine and and an order has been placed for a Hydroxychloroquine level,  you will need to have it drawn 4 hours or more after your last dose.  If you wish to have your labs drawn at another location, please call the office 24 hours in advance so we can fax the orders.  The office is located at [address], [address], [place], [place] [address]   If you have any questions regarding directions or hours of operation,  please call [phone].   As a reminder, please drink plenty of water prior to coming for your lab work. Thanks!   Vaccines You are taking a medication(s) that can suppress your immune system.  The following immunizations are recommended: Flu annually Covid-19  RSV Td/Tdap (tetanus,  diphtheria, pertussis) every 10 years Pneumonia (Prevnar 15 then Pneumovax 23 at least 1 year apart.  Alternatively, can take Prevnar 20 without needing additional dose) Shingrix: 2 doses from 4 weeks to 6 months apart  Please check with your PCP to make sure you are up to date.   If you have signs or symptoms of an infection or start antibiotics: First, call your PCP for workup of your infection. Hold your medication through the infection, until you complete your antibiotics, and until symptoms resolve if you take the following: Injectable medication (Actemra, Benlysta, Cimzia, Cosentyx, Enbrel, Humira , Kevzara, Orencia, Remicade , Simponi, Stelara, Taltz, Tremfya) Methotrexate  Leflunomide (Arava) Mycophenolate (Cellcept) Earma, Olumiant, or Rinvoq   Please get an annual skin examination to screen for skin cancer while you are on Humira .  Please use sunscreen and sun protection.

## 2023-12-30 ENCOUNTER — Other Ambulatory Visit (HOSPITAL_COMMUNITY): Payer: Self-pay

## 2024-01-05 ENCOUNTER — Other Ambulatory Visit: Payer: Self-pay

## 2024-01-05 NOTE — Progress Notes (Signed)
 Specialty Pharmacy Refill Coordination Note  Joshua English is a 38 y.o. male contacted today regarding refills of specialty medication(s) Adalimumab  (Humira  (2 Pen))   Patient requested Delivery   Delivery date: 01/11/24   Verified address: [address]  [place] [address]   Medication will be filled on: 01/10/24

## 2024-01-10 ENCOUNTER — Other Ambulatory Visit: Payer: Self-pay

## 2024-01-31 ENCOUNTER — Other Ambulatory Visit: Payer: Self-pay | Admitting: Physician Assistant

## 2024-01-31 ENCOUNTER — Other Ambulatory Visit: Payer: Self-pay

## 2024-01-31 DIAGNOSIS — L4050 Arthropathic psoriasis, unspecified: Secondary | ICD-10-CM

## 2024-01-31 MED ORDER — HUMIRA (2 PEN) 40 MG/0.4ML ~~LOC~~ AJKT
AUTO-INJECTOR | SUBCUTANEOUS | 0 refills | Status: DC
  Filled 2024-01-31: qty 0.8, fill #0
  Filled 2024-02-02: qty 0.8, 28d supply, fill #0

## 2024-01-31 NOTE — Telephone Encounter (Signed)
 Last Fill: 11/09/2023  Labs: 10/14/2023: CBC and CMP normal.   TB Gold: 04/08/2023 Negative   Next Visit: 05/22/2024  Last Visit: 12/22/2023  DX: Psoriatic arthritis   Current Dose per office note 12/22/2023: Humira  40 mg sq injection every 14 days  Patient will update labs tomorrow.  Okay to refill Humira ?

## 2024-02-01 ENCOUNTER — Other Ambulatory Visit: Payer: Self-pay | Admitting: *Deleted

## 2024-02-01 ENCOUNTER — Other Ambulatory Visit (HOSPITAL_COMMUNITY): Payer: Self-pay

## 2024-02-01 DIAGNOSIS — Z13220 Encounter for screening for lipoid disorders: Secondary | ICD-10-CM

## 2024-02-01 DIAGNOSIS — Z79899 Other long term (current) drug therapy: Secondary | ICD-10-CM

## 2024-02-02 ENCOUNTER — Ambulatory Visit: Payer: Self-pay | Admitting: Rheumatology

## 2024-02-02 ENCOUNTER — Other Ambulatory Visit (HOSPITAL_COMMUNITY): Payer: Self-pay

## 2024-02-02 ENCOUNTER — Other Ambulatory Visit: Payer: Self-pay

## 2024-02-02 LAB — CBC WITH DIFFERENTIAL/PLATELET
Absolute Lymphocytes: 3617 {cells}/uL (ref 850–3900)
Absolute Monocytes: 931 {cells}/uL (ref 200–950)
Basophils Absolute: 75 {cells}/uL (ref 0–200)
Basophils Relative: 0.7 %
Eosinophils Absolute: 118 {cells}/uL (ref 15–500)
Eosinophils Relative: 1.1 %
HCT: 42.8 % (ref 39.4–51.1)
Hemoglobin: 14.4 g/dL (ref 13.2–17.1)
MCH: 31.4 pg (ref 27.0–33.0)
MCHC: 33.6 g/dL (ref 31.6–35.4)
MCV: 93.4 fL (ref 81.4–101.7)
MPV: 10 fL (ref 7.5–12.5)
Monocytes Relative: 8.7 %
Neutro Abs: 5960 {cells}/uL (ref 1500–7800)
Neutrophils Relative %: 55.7 %
Platelets: 330 Thousand/uL (ref 140–400)
RBC: 4.58 Million/uL (ref 4.20–5.80)
RDW: 12.6 % (ref 11.0–15.0)
Total Lymphocyte: 33.8 %
WBC: 10.7 Thousand/uL (ref 3.8–10.8)

## 2024-02-02 LAB — COMPREHENSIVE METABOLIC PANEL WITH GFR
AG Ratio: 1.6 (calc) (ref 1.0–2.5)
ALT: 38 U/L (ref 9–46)
AST: 21 U/L (ref 10–40)
Albumin: 4.5 g/dL (ref 3.6–5.1)
Alkaline phosphatase (APISO): 84 U/L (ref 36–130)
BUN: 10 mg/dL (ref 7–25)
CO2: 29 mmol/L (ref 20–32)
Calcium: 9.5 mg/dL (ref 8.6–10.3)
Chloride: 102 mmol/L (ref 98–110)
Creat: 0.81 mg/dL (ref 0.60–1.26)
Globulin: 2.9 g/dL (ref 1.9–3.7)
Glucose, Bld: 92 mg/dL (ref 65–99)
Potassium: 4.1 mmol/L (ref 3.5–5.3)
Sodium: 138 mmol/L (ref 135–146)
Total Bilirubin: 0.7 mg/dL (ref 0.2–1.2)
Total Protein: 7.4 g/dL (ref 6.1–8.1)
eGFR: 116 mL/min/1.73m2

## 2024-02-02 LAB — LIPID PANEL
Cholesterol: 172 mg/dL
HDL: 44 mg/dL
LDL Cholesterol (Calc): 101 mg/dL — ABNORMAL HIGH
Non-HDL Cholesterol (Calc): 128 mg/dL
Total CHOL/HDL Ratio: 3.9 (calc)
Triglycerides: 175 mg/dL — ABNORMAL HIGH

## 2024-02-02 NOTE — Progress Notes (Signed)
 Triglycerides and LDL are elevated.  CBC and CMP are normal.  Please forward results to his PCP.

## 2024-02-02 NOTE — Progress Notes (Signed)
 Specialty Pharmacy Refill Coordination Note  Spoke with Joshua English is a 38 y.o. male contacted today regarding refills of specialty medication(s) Adalimumab  (Humira  (2 Pen))  Doses on hand: 0  Next inj: 02/10/24   Patient requested: Delivery   Delivery date: 02/08/24   Verified address: [address] [place] [address]  Medication will be filled on 02/07/24

## 2024-02-07 ENCOUNTER — Other Ambulatory Visit: Payer: Self-pay

## 2024-02-09 ENCOUNTER — Other Ambulatory Visit: Payer: Self-pay | Admitting: *Deleted

## 2024-02-09 NOTE — Telephone Encounter (Signed)
 Refill request received via fax from [facility] for Flexeril    Last Fill: 12/22/2023  Next Visit: 05/22/2024  Last Visit: 12/22/2023  Dx: Chronic SI joint pain   Current Dose per office note on 12/22/2023: Flexeril  5 mg p.o. nightly   Okay to refill Flexeril ?

## 2024-02-10 MED ORDER — CYCLOBENZAPRINE HCL 5 MG PO TABS: 5.0000 mg | ORAL_TABLET | Freq: Every day | ORAL | 0 refills | Status: DC

## 2024-02-28 ENCOUNTER — Other Ambulatory Visit (HOSPITAL_COMMUNITY): Payer: Self-pay

## 2024-02-28 ENCOUNTER — Other Ambulatory Visit: Payer: Self-pay

## 2024-02-28 ENCOUNTER — Other Ambulatory Visit: Payer: Self-pay | Admitting: Physician Assistant

## 2024-02-28 DIAGNOSIS — L4050 Arthropathic psoriasis, unspecified: Secondary | ICD-10-CM

## 2024-02-28 MED ORDER — CYCLOBENZAPRINE HCL 5 MG PO TABS: 5.0000 mg | ORAL_TABLET | Freq: Every day | ORAL | 0 refills | Status: AC

## 2024-02-28 MED ORDER — HUMIRA (2 PEN) 40 MG/0.4ML ~~LOC~~ AJKT
AUTO-INJECTOR | SUBCUTANEOUS | 0 refills | Status: AC
  Filled 2024-02-28: qty 0.8, fill #0
  Filled 2024-03-02: qty 0.8, 28d supply, fill #0

## 2024-02-28 NOTE — Telephone Encounter (Signed)
 Last Fill: 02/10/2024  Next Visit: 05/22/2024  Last Visit: 12/22/2023  Dx: Chronic SI joint pain   Current Dose per office note on 12/22/2023: Flexeril  5 mg p.o. nightly.   Okay to refill Flexeril ?

## 2024-02-28 NOTE — Telephone Encounter (Signed)
 Last Fill: 01/31/2024  Labs: 02/01/2024 Triglycerides and LDL are elevated. CBC and CMP are normal.   TB Gold: 04/08/2023 Negative  Next Visit: 05/22/2024  Last Visit: 12/22/2023  IK:Ednmpjupr arthritis   Current Dose per office note 12/22/2023: Humira  40 mg sq injection every 14 days   Okay to refill Humira ?

## 2024-03-02 ENCOUNTER — Other Ambulatory Visit: Payer: Self-pay | Admitting: Pharmacy Technician

## 2024-03-02 ENCOUNTER — Other Ambulatory Visit: Payer: Self-pay

## 2024-03-02 NOTE — Progress Notes (Signed)
 Specialty Pharmacy Refill Coordination Note  Joshua English is a 39 y.o. male contacted today regarding refills of specialty medication(s) Adalimumab  (Humira  (2 Pen))   Patient requested Delivery   Delivery date: 03/08/24   Verified address: [address] [place] [address]   Medication will be filled on: 03/07/24

## 2024-03-07 ENCOUNTER — Other Ambulatory Visit: Payer: Self-pay

## 2024-05-22 ENCOUNTER — Ambulatory Visit: Admitting: Physician Assistant

## 2024-05-23 ENCOUNTER — Ambulatory Visit: Admitting: Dermatology
